# Patient Record
Sex: Male | Born: 1965
Health system: Southern US, Community
[De-identification: ages and names within clinical notes are randomized; demographics above are authoritative.]

## PROBLEM LIST (undated history)

## (undated) DIAGNOSIS — I10 Essential (primary) hypertension: Secondary | ICD-10-CM

## (undated) DIAGNOSIS — Z8619 Personal history of other infectious and parasitic diseases: Secondary | ICD-10-CM

## (undated) HISTORY — DX: Personal history of other infectious and parasitic diseases: Z86.19

---
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
## 2012-10-22 ENCOUNTER — Ambulatory Visit: Payer: Self-pay | Admitting: Family Medicine

## 2013-10-28 ENCOUNTER — Other Ambulatory Visit (HOSPITAL_COMMUNITY): Payer: Self-pay | Admitting: Urology

## 2013-10-28 DIAGNOSIS — N50819 Testicular pain, unspecified: Secondary | ICD-10-CM

## 2013-11-10 ENCOUNTER — Ambulatory Visit (HOSPITAL_COMMUNITY): Payer: 59

## 2014-06-18 LAB — LIPID PANEL
CHOLESTEROL: 177 mg/dL (ref 0–200)
HDL: 52 mg/dL (ref 35–70)
LDL Cholesterol: 106 mg/dL
Triglycerides: 94 mg/dL (ref 40–160)

## 2014-06-18 LAB — BASIC METABOLIC PANEL
BUN: 12 mg/dL (ref 4–21)
Creatinine: 1.2 mg/dL (ref 0.6–1.3)
Glucose: 95 mg/dL
POTASSIUM: 4.3 mmol/L (ref 3.4–5.3)
SODIUM: 142 mmol/L (ref 137–147)

## 2014-06-18 LAB — TSH: TSH: 0.88 u[IU]/mL (ref 0.41–5.90)

## 2015-07-13 ENCOUNTER — Encounter: Payer: Self-pay | Admitting: Family Medicine

## 2015-07-13 ENCOUNTER — Ambulatory Visit (INDEPENDENT_AMBULATORY_CARE_PROVIDER_SITE_OTHER): Payer: 59 | Admitting: Family Medicine

## 2015-07-13 VITALS — BP 148/94 | HR 84 | Temp 98.9°F | Resp 16 | Ht 70.25 in | Wt 207.0 lb

## 2015-07-13 DIAGNOSIS — N529 Male erectile dysfunction, unspecified: Secondary | ICD-10-CM

## 2015-07-13 DIAGNOSIS — Z0000 Encounter for general adult medical examination without abnormal findings: Secondary | ICD-10-CM | POA: Diagnosis not present

## 2015-07-13 DIAGNOSIS — R030 Elevated blood-pressure reading, without diagnosis of hypertension: Secondary | ICD-10-CM | POA: Diagnosis not present

## 2015-07-13 DIAGNOSIS — N528 Other male erectile dysfunction: Secondary | ICD-10-CM

## 2015-07-13 DIAGNOSIS — J309 Allergic rhinitis, unspecified: Secondary | ICD-10-CM | POA: Insufficient documentation

## 2015-07-13 DIAGNOSIS — I10 Essential (primary) hypertension: Secondary | ICD-10-CM | POA: Insufficient documentation

## 2015-07-13 DIAGNOSIS — M549 Dorsalgia, unspecified: Secondary | ICD-10-CM | POA: Insufficient documentation

## 2015-07-13 DIAGNOSIS — K219 Gastro-esophageal reflux disease without esophagitis: Secondary | ICD-10-CM | POA: Insufficient documentation

## 2015-07-13 MED ORDER — TADALAFIL 20 MG PO TABS: 20.0000 mg | ORAL_TABLET | ORAL | Status: DC | PRN

## 2015-07-13 NOTE — Progress Notes (Signed)
Patient: Andrew Green Boddy, Male    DOB: [birth year], 49 y.o.   MRN: [id number] Visit Date: 07/13/2015  Today's Provider: Mila Merryonald Bettie Capistran, MD   Chief Complaint  Patient presents with  . Annual Exam   Subjective:    Annual physical exam Andrew Green Jamil is a 49 y.o. male who presents today for health maintenance and complete physical. He feels well. He reports exercising; yes. He reports he is sleeping well.  -----------------------------------------------------------------  Follow-up for ED from 06/14/2014; started Cialis 20 mg x1 qd prn. Follow-up for elevated Bp from 06/14/2014; advised to exercise to stay healthy.   Review of Systems  Constitutional: Negative.   HENT: Positive for tinnitus.   Eyes: Negative.   Respiratory: Negative.   Cardiovascular: Negative.   Gastrointestinal: Negative.   Endocrine: Negative.   Genitourinary: Negative.   Musculoskeletal: Positive for back pain.  Skin: Negative.   Allergic/Immunologic: Negative.   Neurological: Negative.   Hematological: Negative.   Psychiatric/Behavioral: Negative.     Social History He  reports that he has never smoked. He does not have any smokeless tobacco history on file. He reports that he does not drink alcohol or use illicit drugs. Social History   Social History  . Marital Status: Married    Spouse Name: N/A  . Number of Children: N/A  . Years of Education: N/A   Social History Main Topics  . Smoking status: Never Smoker   . Smokeless tobacco: None  . Alcohol Use: No  . Drug Use: No  . Sexual Activity: Not Asked   Other Topics Concern  . None   Social History Narrative    Patient Active Problem List   Diagnosis Date Noted  . Allergic rhinitis 07/13/2015  . Back pain with radiation 07/13/2015  . Blood pressure elevated without history of HTN 07/13/2015  . GERD (gastroesophageal reflux disease) 07/13/2015  . Testicular hypofunction 02/07/2010  . ED (erectile dysfunction) of organic  origin 12/20/2009    Past Surgical History  Procedure Laterality Date  . Hernia repair  1970    Family History  Family Status  Relation Status Death Age  . Mother Alive   . Father Alive   . Sister Alive   . Maternal Grandmother Deceased   . Paternal Grandmother Deceased    His family history includes Brain cancer in his maternal grandmother; Cervical cancer in his mother; Colon polyps in his father; Congestive Heart Failure in his paternal grandmother; Lung cancer in his maternal grandmother. There is no history of CAD.    No Known Allergies  Previous Medications   TADALAFIL (CIALIS) 20 MG TABLET    Take 1 tablet by mouth as needed. No more than one in a day    Patient Care Team: Malva Limesonald E Ladarion Munyon, MD as PCP - General (Family Medicine) Deirdre Eveneravid C Kowalski, MD (Dermatology)     Objective:   Vitals: BP 140/94 mmHg  Pulse 84  Temp(Src) 98.9 F (37.2 C) (Oral)  Resp 16  Ht 5' 10.25" (1.784 m)  Wt 207 lb (93.895 kg)  BMI 29.50 kg/m2  SpO2 96%   Physical Exam   General Appearance:    Alert, cooperative, no distress, appears stated age  Head:    Normocephalic, without obvious abnormality, atraumatic  Eyes:    PERRL, conjunctiva/corneas clear, EOM's intact, fundi    benign, both eyes       Ears:    Normal TM's and external ear canals, both ears  Nose:  Nares normal, septum midline, mucosa normal, no drainage   or sinus tenderness  Throat:   Lips, mucosa, and tongue normal; teeth and gums normal  Neck:   Supple, symmetrical, trachea midline, no adenopathy;       thyroid:  No enlargement/tenderness/nodules; no carotid   bruit or JVD  Back:     Symmetric, no curvature, ROM normal, no CVA tenderness  Lungs:     Clear to auscultation bilaterally, respirations unlabored  Chest wall:    No tenderness or deformity  Heart:    Regular rate and rhythm, S1 and S2 normal, no murmur, rub   or gallop  Abdomen:     Soft, non-tender, bowel sounds active all four quadrants,    no  masses, no organomegaly  Genitalia:    penis: no lesions or discharge. testes: no masses or tenderness. no hernias  Rectal:    deferred  Extremities:   Extremities normal, atraumatic, no cyanosis or edema  Pulses:   2+ and symmetric all extremities  Skin:   Skin color, texture, turgor normal, no rashes or lesions  Lymph nodes:   Cervical, supraclavicular, and axillary nodes normal  Neurologic:   CNII-XII intact. Normal strength, sensation and reflexes      throughout    Depression Screen PHQ 2/9 Scores 07/13/2015  PHQ - 2 Score 0  PHQ- 9 Score 2      Assessment & Plan:     Routine Health Maintenance and Physical Exam  Exercise Activities and Dietary recommendations Goals    None      Immunization History  Administered Date(s) Administered  . Tdap 12/20/2009    Health Maintenance  Topic Date Due  . HIV Screening  09/01/1981  . INFLUENZA VACCINE  03/22/2015  . TETANUS/TDAP  12/21/2019      Discussed health benefits of physical activity, and encouraged him to engage in regular exercise appropriate for his age and condition.    --------------------------------------------------------------------  1. Annual physical exam Encourage increase exercise and wt loss  2. Blood pressure elevated without history of HTN DASH diet. Recheck BP in 4 months. Advised will need medications if not better at follow up.   3. ED (erectile dysfunction) of organic origin  - tadalafil (CIALIS) 20 MG tablet; Take 1 tablet (20 mg total) by mouth as needed. No more than one in a day  Dispense: 10 tablet; Refill: 4

## 2015-07-13 NOTE — Patient Instructions (Signed)
DASH Eating Plan  DASH stands for "Dietary Approaches to Stop Hypertension." The DASH eating plan is a healthy eating plan that has been shown to reduce high blood pressure (hypertension). Additional health benefits may include reducing the risk of type 2 diabetes mellitus, heart disease, and stroke. The DASH eating plan may also help with weight loss.  WHAT DO I NEED TO KNOW ABOUT THE DASH EATING PLAN?  For the DASH eating plan, you will follow these general guidelines:  · Choose foods with a percent daily value for sodium of less than 5% (as listed on the food label).  · Use salt-free seasonings or herbs instead of table salt or sea salt.  · Check with your health care provider or pharmacist before using salt substitutes.  · Eat lower-sodium products, often labeled as "lower sodium" or "no salt added."  · Eat fresh foods.  · Eat more vegetables, fruits, and low-fat dairy products.  · Choose whole grains. Look for the word "whole" as the first word in the ingredient list.  · Choose fish and skinless chicken or turkey more often than red meat. Limit fish, poultry, and meat to 6 oz (170 g) each day.  · Limit sweets, desserts, sugars, and sugary drinks.  · Choose heart-healthy fats.  · Limit cheese to 1 oz (28 g) per day.  · Eat more home-cooked food and less restaurant, buffet, and fast food.  · Limit fried foods.  · Cook foods using methods other than frying.  · Limit canned vegetables. If you do use them, rinse them well to decrease the sodium.  · When eating at a restaurant, ask that your food be prepared with less salt, or no salt if possible.  WHAT FOODS CAN I EAT?  Seek help from a dietitian for individual calorie needs.  Grains  Whole grain or whole wheat bread. Brown rice. Whole grain or whole wheat pasta. Quinoa, bulgur, and whole grain cereals. Low-sodium cereals. Corn or whole wheat flour tortillas. Whole grain cornbread. Whole grain crackers. Low-sodium crackers.  Vegetables  Fresh or frozen vegetables  (raw, steamed, roasted, or grilled). Low-sodium or reduced-sodium tomato and vegetable juices. Low-sodium or reduced-sodium tomato sauce and paste. Low-sodium or reduced-sodium canned vegetables.   Fruits  All fresh, canned (in natural juice), or frozen fruits.  Meat and Other Protein Products  Ground beef (85% or leaner), grass-fed beef, or beef trimmed of fat. Skinless chicken or turkey. Ground chicken or turkey. Pork trimmed of fat. All fish and seafood. Eggs. Dried beans, peas, or lentils. Unsalted nuts and seeds. Unsalted canned beans.  Dairy  Low-fat dairy products, such as skim or 1% milk, 2% or reduced-fat cheeses, low-fat ricotta or cottage cheese, or plain low-fat yogurt. Low-sodium or reduced-sodium cheeses.  Fats and Oils  Tub margarines without trans fats. Light or reduced-fat mayonnaise and salad dressings (reduced sodium). Avocado. Safflower, olive, or canola oils. Natural peanut or almond butter.  Other  Unsalted popcorn and pretzels.  The items listed above may not be a complete list of recommended foods or beverages. Contact your dietitian for more options.  WHAT FOODS ARE NOT RECOMMENDED?  Grains  White bread. White pasta. White rice. Refined cornbread. Bagels and croissants. Crackers that contain trans fat.  Vegetables  Creamed or fried vegetables. Vegetables in a cheese sauce. Regular canned vegetables. Regular canned tomato sauce and paste. Regular tomato and vegetable juices.  Fruits  Dried fruits. Canned fruit in light or heavy syrup. Fruit juice.  Meat and Other Protein   Products  Fatty cuts of meat. Ribs, chicken wings, bacon, sausage, bologna, salami, chitterlings, fatback, hot dogs, bratwurst, and packaged luncheon meats. Salted nuts and seeds. Canned beans with salt.  Dairy  Whole or 2% milk, cream, half-and-half, and cream cheese. Whole-fat or sweetened yogurt. Full-fat cheeses or blue cheese. Nondairy creamers and whipped toppings. Processed cheese, cheese spreads, or cheese  curds.  Condiments  Onion and garlic salt, seasoned salt, table salt, and sea salt. Canned and packaged gravies. Worcestershire sauce. Tartar sauce. Barbecue sauce. Teriyaki sauce. Soy sauce, including reduced sodium. Steak sauce. Fish sauce. Oyster sauce. Cocktail sauce. Horseradish. Ketchup and mustard. Meat flavorings and tenderizers. Bouillon cubes. Hot sauce. Tabasco sauce. Marinades. Taco seasonings. Relishes.  Fats and Oils  Butter, stick margarine, lard, shortening, ghee, and bacon fat. Coconut, palm kernel, or palm oils. Regular salad dressings.  Other  Pickles and olives. Salted popcorn and pretzels.  The items listed above may not be a complete list of foods and beverages to avoid. Contact your dietitian for more information.  WHERE CAN I FIND MORE INFORMATION?  National Heart, Lung, and [employer] Institute: www.nhlbi.nih.gov/health/health-topics/topics/dash/     This information is not intended to replace advice given to you by your health care provider. Make sure you discuss any questions you have with your health care provider.     Document Released: 07/27/2011 Document Revised: 08/28/2014 Document Reviewed: 06/11/2013  Elsevier Interactive Patient Education ©2016 [employer] Inc[employer]

## 2015-07-14 ENCOUNTER — Encounter: Payer: Self-pay | Admitting: Family Medicine

## 2015-07-27 ENCOUNTER — Encounter: Payer: Self-pay | Admitting: Family Medicine

## 2015-07-27 ENCOUNTER — Ambulatory Visit (INDEPENDENT_AMBULATORY_CARE_PROVIDER_SITE_OTHER): Payer: 59 | Admitting: Family Medicine

## 2015-07-27 VITALS — BP 130/102 | HR 88 | Resp 16 | Ht 70.25 in

## 2015-07-27 DIAGNOSIS — H6122 Impacted cerumen, left ear: Secondary | ICD-10-CM

## 2015-07-27 DIAGNOSIS — H6120 Impacted cerumen, unspecified ear: Secondary | ICD-10-CM | POA: Insufficient documentation

## 2015-07-27 DIAGNOSIS — M792 Neuralgia and neuritis, unspecified: Secondary | ICD-10-CM | POA: Diagnosis not present

## 2015-07-27 MED ORDER — PREDNISONE 20 MG PO TABS: 20.0000 mg | ORAL_TABLET | Freq: Two times a day (BID) | ORAL | Status: AC

## 2015-07-27 MED ORDER — NEOMYCIN-POLYMYXIN-HC 3.5-10000-1 OT SUSP: 3.0000 [drp] | Freq: Four times a day (QID) | OTIC | Status: DC

## 2015-07-27 NOTE — Progress Notes (Signed)
       Patient: Andrew Green Male    DOB: [birth year]   49 y.o.   MRN: [id number] Visit Date: 07/27/2015  Today's Provider: Mila Merryonald Fisher, MD   Chief Complaint  Patient presents with  . Headache   Subjective:    HPI  Having sharp pain in left side of his head above his ear. Pain happens several times a day and lasts for 3-4 seconds. Pain began over the weekend. Patient has had nasal congestion and cough. Non-productive.    No Known Allergies Previous Medications   TADALAFIL (CIALIS) 20 MG TABLET    Take 1 tablet (20 mg total) by mouth as needed. No more than one in a day    Review of Systems  Constitutional: Negative for fever, chills, appetite change and fatigue.  HENT: Positive for congestion. Negative for ear pain, hearing loss, nosebleeds and trouble swallowing.   Eyes: Negative for pain and visual disturbance.  Respiratory: Negative for cough, chest tightness, shortness of breath and wheezing.   Cardiovascular: Negative for chest pain, palpitations and leg swelling.  Gastrointestinal: Negative for nausea, vomiting, abdominal pain, diarrhea, constipation and blood in stool.  Endocrine: Negative for polydipsia, polyphagia and polyuria.  Genitourinary: Negative for dysuria and flank pain.  Musculoskeletal: Negative for myalgias, back pain, joint swelling, arthralgias and neck stiffness.  Skin: Negative for color change, rash and wound.  Neurological: Negative for dizziness, tremors, seizures, speech difficulty, weakness, light-headedness and headaches.  Psychiatric/Behavioral: Negative for behavioral problems, confusion, sleep disturbance, dysphoric mood and decreased concentration. The patient is not nervous/anxious.   All other systems reviewed and are negative.   Social History  Substance Use Topics  . Smoking status: Never Smoker   . Smokeless tobacco: Not on file  . Alcohol Use: No   Objective:   BP 130/102 mmHg  Pulse 88  Resp 16  Ht 5' 10.25" (1.784  m)  Physical Exam  General Appearance:    Alert, cooperative, no distress  HENT:   bilateral TM normal without fluid or infection, neck without nodes and sinuses nontender. Left ear canal obstructed by cerumen. Mild redness and swelling of postteior canal after irrigation.   Eyes:    PERRL, conjunctiva/corneas clear, EOM's intact       Lungs:     Clear to auscultation bilaterally, respirations unlabored  Heart:    Regular rate and rhythm  Neurologic:   Awake, alert, oriented x 3. No apparent focal neurological           defect.           Assessment & Plan:     1. Neuralgia Possible secondary to OE or inflammation from cerumen impaction as below.  Start prednisone 20mg  twice a day x 7 days and cortisporin Otic drops. Marland Kitchen. He is to call if not completely resolved when finished.   2. Cerumen impaction, left Cleared by irrigation.   He is to call if not completely resolved in a week.       Mila Merryonald Fisher, MD  [facility]

## 2015-10-22 ENCOUNTER — Encounter: Payer: Self-pay | Admitting: Family Medicine

## 2015-10-22 ENCOUNTER — Ambulatory Visit (INDEPENDENT_AMBULATORY_CARE_PROVIDER_SITE_OTHER): Payer: 59 | Admitting: Family Medicine

## 2015-10-22 VITALS — BP 152/84 | HR 98 | Temp 99.1°F | Resp 18 | Wt 211.0 lb

## 2015-10-22 DIAGNOSIS — R52 Pain, unspecified: Secondary | ICD-10-CM

## 2015-10-22 DIAGNOSIS — B349 Viral infection, unspecified: Secondary | ICD-10-CM

## 2015-10-22 LAB — POC INFLUENZA A&B (BINAX/QUICKVUE)
Influenza A, POC: NEGATIVE
Influenza B, POC: NEGATIVE

## 2015-10-22 MED ORDER — OSELTAMIVIR PHOSPHATE 75 MG PO CAPS: 75.0000 mg | ORAL_CAPSULE | Freq: Two times a day (BID) | ORAL | Status: DC

## 2015-10-22 NOTE — Patient Instructions (Signed)

## 2015-10-22 NOTE — Progress Notes (Signed)
Patient ID: Andrew Green, male   DOB: 1965/08/26, 50 y.o.   MRN: [id number]       Patient: Andrew Green Male    DOB: 1965/08/26   50 y.o.   MRN: [id number] Visit Date: 10/22/2015  Today's Provider: Dortha Kernennis Chrismon, PA   Chief Complaint  Patient presents with  . Generalized Body Aches  . Cough   Subjective:    HPI Pt is here today for body aches and cough. He reports that the cough started last night. He took OTC cold medications and this morning when he woke up he has body aches. He reports that its not exactly his whole body that aches its the usual parts of his body are aching that normally ache when "something isn't right". He has a slight sore throat and nasal congestion. He denies any known high fevers (he does have a low grade today), or shortness of breath. He has a 478 month old grand child at home and wants to catch this quick if it could possibly be the flu.   Patient Active Problem List   Diagnosis Date Noted  . Neuralgia 07/27/2015  . Cerumen impaction 07/27/2015  . Allergic rhinitis 07/13/2015  . Blood pressure elevated without history of HTN 07/13/2015  . GERD (gastroesophageal reflux disease) 07/13/2015  . Testicular hypofunction 02/07/2010  . ED (erectile dysfunction) of organic origin 12/20/2009   Past Surgical History  Procedure Laterality Date  . Hernia repair  1970   Family History  Problem Relation Age of Onset  . Cervical cancer Mother   . Colon polyps Father   . Lung cancer Maternal Grandmother   . Brain cancer Maternal Grandmother   . Congestive Heart Failure Paternal Grandmother   . CAD Neg Hx    No Known Allergies   Previous Medications   TADALAFIL (CIALIS) 20 MG TABLET    Take 1 tablet (20 mg total) by mouth as needed. No more than one in a day    Review of Systems  Constitutional: Positive for chills, activity change and fatigue.  HENT: Positive for congestion, rhinorrhea and sore throat.   Eyes: Negative.   Respiratory: Negative.     Cardiovascular: Negative.   Gastrointestinal: Negative.   Endocrine: Negative.   Genitourinary: Negative.   Musculoskeletal: Positive for myalgias and arthralgias.  Skin: Negative.   Allergic/Immunologic: Negative.   Neurological: Negative.   Hematological: Negative.   Psychiatric/Behavioral: Negative.     Social History  Substance Use Topics  . Smoking status: Never Smoker   . Smokeless tobacco: Not on file  . Alcohol Use: No   Objective:   BP 152/84 mmHg  Pulse 98  Temp(Src) 99.1 F (37.3 C) (Oral)  Resp 18  Wt 211 lb (95.709 kg)  SpO2 98%  Physical Exam  Constitutional: He is oriented to person, place, and time. He appears well-developed and well-nourished. No distress.  HENT:  Head: Normocephalic and atraumatic.  Right Ear: Hearing and external ear normal.  Left Ear: Hearing and external ear normal.  Nose: Nose normal.  Mouth/Throat: Oropharynx is clear and moist.  Eyes: Conjunctivae, EOM and lids are normal. Right eye exhibits no discharge. Left eye exhibits no discharge. No scleral icterus.  Neck: Neck supple.  Cardiovascular: Normal rate and regular rhythm.   Pulmonary/Chest: Effort normal. No respiratory distress.  Abdominal: Soft. Bowel sounds are normal.  Musculoskeletal: Normal range of motion.  Neurological: He is alert and oriented to person, place, and time.  Skin: Skin is intact. No lesion  and no rash noted.  Psychiatric: He has a normal mood and affect. His speech is normal and behavior is normal. Thought content normal.      Assessment & Plan:     1. Body aches Onset yesterday with slight ticklish cough and generalized body aches with fatigue. No nausea, vomiting or diarrhea. Temperature low grade elevation today. May use Tylenol or Advil prn. Increase fluid intake and home to rest. Flu test negative. - POC Influenza A&B(BINAX/QUICKVUE)  2. Viral illness Recent onset with slight headache, cough and scratchy throat with generalized malaise. May  continue to use Nyquil prn cough and add Tamiflu. Recheck if no better in 5-7 days. Should be out of work for the next 4 days to be at home resting. Recheck prn. - oseltamivir (TAMIFLU) 75 MG capsule; Take 1 capsule (75 mg total) by mouth 2 (two) times daily.  Dispense: 10 capsule; Refill: 0       Dortha Kern, PA  [facility]

## 2015-10-27 ENCOUNTER — Encounter: Payer: Self-pay | Admitting: Family Medicine

## 2015-10-29 ENCOUNTER — Ambulatory Visit (INDEPENDENT_AMBULATORY_CARE_PROVIDER_SITE_OTHER): Payer: 59 | Admitting: Family Medicine

## 2015-10-29 ENCOUNTER — Encounter: Payer: Self-pay | Admitting: Family Medicine

## 2015-10-29 VITALS — BP 124/98 | HR 90 | Temp 98.8°F | Resp 14 | Wt 206.4 lb

## 2015-10-29 DIAGNOSIS — L089 Local infection of the skin and subcutaneous tissue, unspecified: Secondary | ICD-10-CM

## 2015-10-29 DIAGNOSIS — J00 Acute nasopharyngitis [common cold]: Secondary | ICD-10-CM | POA: Diagnosis not present

## 2015-10-29 DIAGNOSIS — L723 Sebaceous cyst: Secondary | ICD-10-CM

## 2015-10-29 DIAGNOSIS — J209 Acute bronchitis, unspecified: Secondary | ICD-10-CM

## 2015-10-29 MED ORDER — HYDROCODONE-HOMATROPINE 5-1.5 MG/5ML PO SYRP: 5.0000 mL | ORAL_SOLUTION | Freq: Three times a day (TID) | ORAL | Status: DC | PRN

## 2015-10-29 MED ORDER — CEPHALEXIN 500 MG PO CAPS: 500.0000 mg | ORAL_CAPSULE | Freq: Three times a day (TID) | ORAL | Status: DC

## 2015-10-29 NOTE — Progress Notes (Signed)
Patient ID: Andrew Ishiharaimothy L Lewey, male   DOB: 07/02/1966, 50 y.o.   MRN: [id number]   Patient: Andrew Green Male    DOB: 07/02/1966   50 y.o.   MRN: [id number] Visit Date: 10/29/2015  Today's Provider: Dortha Kernennis Chrismon, PA   Chief Complaint  Patient presents with  . Cough  . Nasal Congestion  . Generalized Body Aches   Subjective:    HPI Patient presents with cough, nasal congestion, and body aches. Patient was seen on 10/22/2015 and prescribed Tamiflu. Patient reports he finished the Tamiflu. Patient reports symptoms were worse on Wednesday and Thursday. Today patient feels better with mild symptoms. Nasal stuffiness more prevalent on the left with ticklish and hacking cough. Tried Advil Cold and Sinus, Claritin and some Mucinex over the past 3-4 days.  Past Medical History  Diagnosis Date  . History of chicken pox   . History of measles   . History of mumps    Patient Active Problem List   Diagnosis Date Noted  . Neuralgia 07/27/2015  . Cerumen impaction 07/27/2015  . Allergic rhinitis 07/13/2015  . Blood pressure elevated without history of HTN 07/13/2015  . GERD (gastroesophageal reflux disease) 07/13/2015  . Testicular hypofunction 02/07/2010  . ED (erectile dysfunction) of organic origin 12/20/2009   Past Surgical History  Procedure Laterality Date  . Hernia repair  1970   Family History  Problem Relation Age of Onset  . Cervical cancer Mother   . Colon polyps Father   . Lung cancer Maternal Grandmother   . Brain cancer Maternal Grandmother   . Congestive Heart Failure Paternal Grandmother   . CAD Neg Hx    Previous Medications   TADALAFIL (CIALIS) 20 MG TABLET    Take 1 tablet (20 mg total) by mouth as needed. No more than one in a day   No Known Allergies  Review of Systems  Constitutional:       Generalized body aches   HENT: Positive for congestion.   Eyes: Negative.   Respiratory: Positive for cough.   Cardiovascular: Negative.   Gastrointestinal:  Negative.   Endocrine: Negative.   Genitourinary: Negative.   Musculoskeletal: Negative.   Skin: Negative.   Allergic/Immunologic: Negative.   Neurological: Negative.   Hematological: Negative.   Psychiatric/Behavioral: Negative.     Social History  Substance Use Topics  . Smoking status: Never Smoker   . Smokeless tobacco: Not on file  . Alcohol Use: No   Objective:   BP 124/98 mmHg  Pulse 90  Temp(Src) 98.8 F (37.1 C) (Oral)  Resp 14  Wt 206 lb 6.4 oz (93.622 kg)  SpO2 97%  Physical Exam  Constitutional: He is oriented to person, place, and time. He appears well-developed.  HENT:  Head: Normocephalic.  Right Ear: External ear normal.  Left Ear: External ear normal.  Nose: Nose normal.  Mouth/Throat: Oropharynx is clear and moist.  Eyes: Conjunctivae and EOM are normal.  Neck: Normal range of motion. Neck supple.  Cardiovascular: Normal rate, regular rhythm and normal heart sounds.   Pulmonary/Chest: Effort normal.  Slightly coarse breath sounds  Abdominal: Soft. Bowel sounds are normal.  Neurological: He is alert and oriented to person, place, and time.  Skin:  1.5 cm inflamed sebaceous cyst on the right upper posterior shoulder. No local lymphadenopathy or lymphangitis.      Assessment & Plan:      1. Subacute bronchitis Still feel general malaise and coughing a lot despite treatment for suspected influenza last  week. Will check CBC for signs of secondary infections. Given Keflex for skin infection and subacute bronchitis. Increase fluid intake and recheck pending CBC report. - CBC with Differential/Platelet - cephALEXin (KEFLEX) 500 MG capsule; Take 1 capsule (500 mg total) by mouth 3 (three) times daily.  Dispense: 21 capsule; Refill: 0 - Comprehensive metabolic panel - HYDROcodone-homatropine (HYCODAN) 5-1.5 MG/5ML syrup; Take 5 mLs by mouth every 8 (eight) hours as needed for cough.  Dispense: 120 mL; Refill: 0  2. Infected sebaceous cyst of skin Cyst  has become larger and tender with redness over the past week. May apply moist heat and given antibiotic. Recheck prn.

## 2015-10-29 NOTE — Patient Instructions (Signed)
Epidermal Cyst An epidermal cyst is sometimes called a sebaceous cyst, epidermal inclusion cyst, or infundibular cyst. These cysts usually contain a substance that looks "pasty" or "cheesy" and may have a bad smell. This substance is a protein called keratin. Epidermal cysts are usually found on the face, neck, or trunk. They may also occur in the vaginal area or other parts of the genitalia of both men and women. Epidermal cysts are usually small, painless, slow-growing bumps or lumps that move freely under the skin. It is important not to try to pop them. This may cause an infection and lead to tenderness and swelling. CAUSES  Epidermal cysts may be caused by a deep penetrating injury to the skin or a plugged hair follicle, often associated with acne. SYMPTOMS  Epidermal cysts can become inflamed and cause:  Redness.  Tenderness.  Increased temperature of the skin over the bumps or lumps.  Grayish-white, bad smelling material that drains from the bump or lump. DIAGNOSIS  Epidermal cysts are easily diagnosed by your caregiver during an exam. Rarely, a tissue sample (biopsy) may be taken to rule out other conditions that may resemble epidermal cysts. TREATMENT   Epidermal cysts often get better and disappear on their own. They are rarely ever cancerous.  If a cyst becomes infected, it may become inflamed and tender. This may require opening and draining the cyst. Treatment with antibiotics may be necessary. When the infection is gone, the cyst may be removed with minor surgery.  Small, inflamed cysts can often be treated with antibiotics or by injecting steroid medicines.  Sometimes, epidermal cysts become large and bothersome. If this happens, surgical removal in your caregiver's office may be necessary. HOME CARE INSTRUCTIONS  Only take over-the-counter or prescription medicines as directed by your caregiver.  Take your antibiotics as directed. Finish them even if you start to feel  better. SEEK MEDICAL CARE IF:   Your cyst becomes tender, red, or swollen.  Your condition is not improving or is getting worse.  You have any other questions or concerns. MAKE SURE YOU:  Understand these instructions.  Will watch your condition.  Will get help right away if you are not doing well or get worse.   This information is not intended to replace advice given to you by your health care provider. Make sure you discuss any questions you have with your health care provider.   Document Released: 07/08/2004 Document Revised: 10/30/2011 Document Reviewed: 02/13/2011 Elsevier Interactive Patient Education 2016 Elsevier Inc.  

## 2015-10-30 LAB — CBC WITH DIFFERENTIAL/PLATELET
BASOS ABS: 0 10*3/uL (ref 0.0–0.2)
BASOS: 0 %
EOS (ABSOLUTE): 0.3 10*3/uL (ref 0.0–0.4)
Eos: 5 %
HEMATOCRIT: 46.1 % (ref 37.5–51.0)
Hemoglobin: 16.5 g/dL (ref 12.6–17.7)
Immature Grans (Abs): 0 10*3/uL (ref 0.0–0.1)
Immature Granulocytes: 0 %
LYMPHS ABS: 2.8 10*3/uL (ref 0.7–3.1)
LYMPHS: 53 %
MCH: 31.4 pg (ref 26.6–33.0)
MCHC: 35.8 g/dL — ABNORMAL HIGH (ref 31.5–35.7)
MCV: 88 fL (ref 79–97)
Monocytes Absolute: 0.5 10*3/uL (ref 0.1–0.9)
Monocytes: 10 %
Neutrophils Absolute: 1.7 10*3/uL (ref 1.4–7.0)
Neutrophils: 32 %
Platelets: 174 10*3/uL (ref 150–379)
RBC: 5.25 x10E6/uL (ref 4.14–5.80)
RDW: 13.2 % (ref 12.3–15.4)
WBC: 5.3 10*3/uL (ref 3.4–10.8)

## 2015-10-30 LAB — COMPREHENSIVE METABOLIC PANEL
ALK PHOS: 79 IU/L (ref 39–117)
ALT: 36 IU/L (ref 0–44)
AST: 29 IU/L (ref 0–40)
Albumin/Globulin Ratio: 1.5 (ref 1.1–2.5)
Albumin: 4.1 g/dL (ref 3.5–5.5)
BUN/Creatinine Ratio: 6 — ABNORMAL LOW (ref 9–20)
BUN: 7 mg/dL (ref 6–24)
Bilirubin Total: 0.5 mg/dL (ref 0.0–1.2)
CO2: 27 mmol/L (ref 18–29)
Calcium: 8.7 mg/dL (ref 8.7–10.2)
Chloride: 97 mmol/L (ref 96–106)
Creatinine, Ser: 1.12 mg/dL (ref 0.76–1.27)
GFR calc Af Amer: 89 mL/min/{1.73_m2} (ref >59)
GFR calc non Af Amer: 77 mL/min/{1.73_m2} (ref >59)
GLUCOSE: 96 mg/dL (ref 65–99)
Globulin, Total: 2.7 g/dL (ref 1.5–4.5)
Potassium: 3.8 mmol/L (ref 3.5–5.2)
Sodium: 136 mmol/L (ref 134–144)
Total Protein: 6.8 g/dL (ref 6.0–8.5)

## 2015-11-02 ENCOUNTER — Telehealth: Payer: Self-pay

## 2015-11-02 NOTE — Telephone Encounter (Signed)
-----   Message from Tamsen Roersennis E Chrismon, [place] sent at 11/01/2015  5:06 PM EDT ----- Essentially normal blood tests. No sign of infection in blood. Finish the Keflex to help sebaceous cyst and clear bronchitis. Recheck in the office in a week if needed.

## 2015-11-02 NOTE — Telephone Encounter (Signed)
Patient advised as directed below. Patient verbalized understanding.  

## 2015-11-10 ENCOUNTER — Encounter: Payer: Self-pay | Admitting: Family Medicine

## 2015-11-10 ENCOUNTER — Ambulatory Visit (INDEPENDENT_AMBULATORY_CARE_PROVIDER_SITE_OTHER): Payer: 59 | Admitting: Family Medicine

## 2015-11-10 VITALS — BP 148/100 | HR 84 | Temp 98.7°F | Resp 16 | Ht 70.5 in | Wt 211.0 lb

## 2015-11-10 DIAGNOSIS — I10 Essential (primary) hypertension: Secondary | ICD-10-CM

## 2015-11-10 NOTE — Progress Notes (Signed)
       Patient: Andrew Green Male    DOB: May 08, 1966   50 y.o.   MRN: [id number] Visit Date: 11/10/2015  Today's Provider: Mila Merryonald Pate Aylward, MD   Chief Complaint  Patient presents with  . Follow-up   Subjective:    HPI  Follow-up for elevated blood pressure without history of hypertension from 07/13/2015; recommended the DASH diet. Advised to recheck blood pressure in 4 months and will need medication if not better at follow-up. He states he has been eating much better, gets very little sodium in diet, but has not been exercising.   BP Readings from Last 3 Encounters:  11/10/15 144/94  07/13/15 140/94   Wt Readings from Last 3 Encounters:  11/10/15 211 lb (95.709 kg)  07/13/15 207 lb    CMP Latest Ref Rng 10/29/2015 06/18/2014  Glucose 65 - 99 mg/dL 96 -  BUN 6 - 24 mg/dL 7 12  Creatinine 4.090.76 - 1.27 mg/dL 8.111.12 1.2  Sodium 914134 - 144 mmol/L 136 142  Potassium 3.5 - 5.2 mmol/L 3.8 4.3  Chloride 96 - 106 mmol/L 97 -  CO2 18 - 29 mmol/L 27 -  Calcium 8.7 - 10.2 mg/dL 8.7 -  Total Protein 6.0 - 8.5 g/dL 6.8 -  Total Bilirubin 0.0 - 1.2 mg/dL 0.5 -  Alkaline Phos 39 - 117 IU/L 79 -  AST 0 - 40 IU/L 29 -  ALT 0 - 44 IU/L 36 -      No Known Allergies Previous Medications   TADALAFIL (CIALIS) 20 MG TABLET    Take 1 tablet (20 mg total) by mouth as needed. No more than one in a day    Review of Systems  Constitutional: Negative for fever, chills and appetite change.  Respiratory: Negative for chest tightness, shortness of breath and wheezing.   Cardiovascular: Negative for chest pain and palpitations.  Gastrointestinal: Negative for nausea, vomiting and abdominal pain.    Social History  Substance Use Topics  . Smoking status: Never Smoker   . Smokeless tobacco: Not on file  . Alcohol Use: No   Objective:     Filed Vitals:   11/10/15 1554 11/10/15 1638  BP: 144/94 148/100  Pulse: 84   Temp: 98.7 F (37.1 C)   TempSrc: Oral   Resp: 16   Height: 5' 10.5"  (1.791 m)   Weight: 211 lb (95.709 kg)   SpO2: 97%      Physical Exam   General Appearance:    Alert, cooperative, no distress  Eyes:    PERRL, conjunctiva/corneas clear, EOM's intact       Lungs:     Clear to auscultation bilaterally, respirations unlabored  Heart:    Regular rate and rhythm  Neurologic:   Awake, alert, oriented x 3. No apparent focal neurological           defect.           Assessment & Plan:     1. Essential hypertension Recommended starting antihypertensive medications. Discussed risks/benefits of medications versus untreated hypertension. He is very opposed to taking prescription medications. Reviewed dietary recommendations and need to exercise 150 minutes per week and attempt 6-10 pound weight loss over the next 4 months. Will again recommend medications if BP not to goal at follow up in 4 months.        Mila Merryonald Lianah Peed, MD  [facility]

## 2015-11-22 ENCOUNTER — Ambulatory Visit (INDEPENDENT_AMBULATORY_CARE_PROVIDER_SITE_OTHER): Payer: 59 | Admitting: Family Medicine

## 2015-11-22 ENCOUNTER — Encounter: Payer: Self-pay | Admitting: Family Medicine

## 2015-11-22 VITALS — BP 116/98 | HR 60 | Temp 98.5°F | Resp 16 | Wt 208.0 lb

## 2015-11-22 DIAGNOSIS — J069 Acute upper respiratory infection, unspecified: Secondary | ICD-10-CM

## 2015-11-22 DIAGNOSIS — J029 Acute pharyngitis, unspecified: Secondary | ICD-10-CM | POA: Diagnosis not present

## 2015-11-22 LAB — POCT RAPID STREP A (OFFICE): Rapid Strep A Screen: NEGATIVE

## 2015-11-22 NOTE — Patient Instructions (Signed)
Allergic Rhinitis Allergic rhinitis is when the mucous membranes in the nose respond to allergens. Allergens are particles in the air that cause your body to have an allergic reaction. This causes you to release allergic antibodies. Through a chain of events, these eventually cause you to release histamine into the blood stream. Although meant to protect the body, it is this release of histamine that causes your discomfort, such as frequent sneezing, congestion, and an itchy, runny nose.  CAUSES Seasonal allergic rhinitis (hay fever) is caused by pollen allergens that may come from grasses, trees, and weeds. Year-round allergic rhinitis (perennial allergic rhinitis) is caused by allergens such as house dust mites, pet dander, and mold spores. SYMPTOMS  Nasal stuffiness (congestion).  Itchy, runny nose with sneezing and tearing of the eyes. DIAGNOSIS Your health care provider can help you determine the allergen or allergens that trigger your symptoms. If you and your health care provider are unable to determine the allergen, skin or blood testing may be used. Your health care provider will diagnose your condition after taking your health history and performing a physical exam. Your health care provider may assess you for other related conditions, such as asthma, pink eye, or an ear infection. TREATMENT Allergic rhinitis does not have a cure, but it can be controlled by:  Medicines that block allergy symptoms. These may include allergy shots, nasal sprays, and oral antihistamines.  Avoiding the allergen. Hay fever may often be treated with antihistamines in pill or nasal spray forms. Antihistamines block the effects of histamine. There are over-the-counter medicines that may help with nasal congestion and swelling around the eyes. Check with your health care provider before taking or giving this medicine. If avoiding the allergen or the medicine prescribed do not work, there are many new medicines  your health care provider can prescribe. Stronger medicine may be used if initial measures are ineffective. Desensitizing injections can be used if medicine and avoidance does not work. Desensitization is when a patient is given ongoing shots until the body becomes less sensitive to the allergen. Make sure you follow up with your health care provider if problems continue. HOME CARE INSTRUCTIONS It is not possible to completely avoid allergens, but you can reduce your symptoms by taking steps to limit your exposure to them. It helps to know exactly what you are allergic to so that you can avoid your specific triggers. SEEK MEDICAL CARE IF:  You have a fever.  You develop a cough that does not stop easily (persistent).  You have shortness of breath.  You start wheezing.  Symptoms interfere with normal daily activities.   This information is not intended to replace advice given to you by your health care provider. Make sure you discuss any questions you have with your health care provider.   Document Released: 05/02/2001 Document Revised: 08/28/2014 Document Reviewed: 04/14/2013 Elsevier Interactive Patient Education 2016 Elsevier Inc.  

## 2015-11-22 NOTE — Progress Notes (Signed)
Patient ID: Andrew Green, male   DOB: 1966/05/21, 50 y.o.   MRN: [id number]       Patient: Andrew Green Male    DOB: 22-Mar-1966   50 y.o.   MRN: [id number] Visit Date: 11/22/2015  Today's Provider: Dortha Kern, PA   Chief Complaint  Patient presents with  . Sore Throat   Subjective:    HPI   Patient comes in office today with concerns of sore throat since Friday 3/31. Patient states that he has had a lot of post nasal drainage but states that his cough is dry. Patient complains of irritation in back of throat and he has noticed white spots in the back of his mouth. Patient denies being expose to strep. Patient reports taking otc Claritin and Tylenol for relief. Denies sneezing but stuffy nose on the left. Slight help with cool drink. Describe sore throat as a scratchy, swollen throat with raspy voice.  Past Medical History  Diagnosis Date  . History of chicken pox   . History of measles   . History of mumps    Patient Active Problem List   Diagnosis Date Noted  . Neuralgia 07/27/2015  . Cerumen impaction 07/27/2015  . Allergic rhinitis 07/13/2015  . Blood pressure elevated without history of HTN 07/13/2015  . GERD (gastroesophageal reflux disease) 07/13/2015  . Testicular hypofunction 02/07/2010  . ED (erectile dysfunction) of organic origin 12/20/2009   Past Surgical History  Procedure Laterality Date  . Hernia repair  1970   Family History  Problem Relation Age of Onset  . Cervical cancer Mother   . Colon polyps Father   . Lung cancer Maternal Grandmother   . Brain cancer Maternal Grandmother   . Congestive Heart Failure Paternal Grandmother   . CAD Neg Hx    No Known Allergies Previous Medications   TADALAFIL (CIALIS) 20 MG TABLET    Take 1 tablet (20 mg total) by mouth as needed. No more than one in a day    Review of Systems  Constitutional: Negative for fever, chills, diaphoresis, activity change, appetite change, fatigue and unexpected weight  change.  HENT: Positive for congestion, postnasal drip, sinus pressure, sore throat and trouble swallowing. Negative for dental problem, drooling, ear discharge, ear pain, facial swelling, hearing loss, mouth sores, nosebleeds, rhinorrhea, sneezing, tinnitus and voice change.   Eyes: Negative.   Respiratory: Positive for cough. Negative for apnea, choking, chest tightness, wheezing and stridor.   Cardiovascular: Negative.   Gastrointestinal: Negative.   Endocrine: Negative.   Genitourinary: Negative.   Musculoskeletal: Negative.   Skin: Negative.   Allergic/Immunologic: Positive for environmental allergies.  Neurological: Positive for tremors. Negative for seizures, syncope, facial asymmetry, speech difficulty, light-headedness, numbness and headaches.  Psychiatric/Behavioral: Negative.     Social History  Substance Use Topics  . Smoking status: Never Smoker   . Smokeless tobacco: Not on file  . Alcohol Use: No   Objective:   BP 116/98 mmHg  Pulse 60  Temp(Src) 98.5 F (36.9 C) (Oral)  Resp 16  Wt 208 lb (94.348 kg)  Physical Exam  Constitutional: He is oriented to person, place, and time. He appears well-developed and well-nourished.  HENT:  Head: Normocephalic and atraumatic.  Right Ear: External ear normal.  Left Ear: External ear normal.  Narrow nostrils without redness of mucus membranes. No sinus tenderness with good transillumination throughout. Posterior pharynx reddened without exudates. Some cobblestone appearance.  Eyes: Conjunctivae and EOM are normal.  Neck: Normal range of motion.  Neck supple.  Cardiovascular: Normal rate.   Pulmonary/Chest: Effort normal and breath sounds normal.  Abdominal: Bowel sounds are normal.  Lymphadenopathy:    He has no cervical adenopathy.  Neurological: He is alert and oriented to person, place, and time.  Skin: No rash noted.  Psychiatric: He has a normal mood and affect. His behavior is normal.      Assessment & Plan:       1. Sore throat Onset over the past 3 days without exudates. Strep test negative. Feels some postnasal drip and scratchy cough. May use Chloroseptic spray and Zyrtec. Increase fluid intake and recheck prn. - POCT rapid strep A  2. Upper respiratory infection Slight cough without sputum production and stuffiness in the left nostril with sore throat. Suspect allergy component. May use Flonase 2 sprays each nostril at bedtime. Recheck prn. No fever today or at home.       Dortha Kernennis Toneka Fullen, PA  [facility]

## 2016-03-08 ENCOUNTER — Ambulatory Visit: Payer: 59 | Admitting: Family Medicine

## 2016-03-29 ENCOUNTER — Ambulatory Visit (INDEPENDENT_AMBULATORY_CARE_PROVIDER_SITE_OTHER): Payer: 59 | Admitting: Family Medicine

## 2016-03-29 ENCOUNTER — Encounter: Payer: Self-pay | Admitting: Family Medicine

## 2016-03-29 VITALS — BP 140/96 | HR 92 | Temp 98.5°F | Resp 16 | Wt 209.0 lb

## 2016-03-29 DIAGNOSIS — R030 Elevated blood-pressure reading, without diagnosis of hypertension: Secondary | ICD-10-CM | POA: Diagnosis not present

## 2016-03-29 NOTE — Progress Notes (Signed)
       Patient: Andrew Green Male    DOB: 01-16-1966   50 y.o.   MRN: [id number] Visit Date: 03/29/2016  Today's Provider: Mila Merryonald Fisher, MD   Chief Complaint  Patient presents with  . Other    High blood pressure.    Subjective:    HPI  Elevated blood pressure, follow-up:  BP Readings from Last 3 Encounters:  03/29/16 (!) 140/96  11/22/15 (!) 116/98  11/10/15 (!) 148/100    He was last seen for hypertension 5 months ago.  BP at that visit was 148/100 Management changes since that visit include patient refused medication, will work on Lifestyle changes. He reports fair compliance with treatment.  He is exercising. He is adherent to low salt diet.   Outside blood pressures are not being checked. He is experiencing none.  Patient denies chest pain.   Cardiovascular risk factors include none.  Use of agents associated with hypertension: none.     Weight trend: stable Wt Readings from Last 3 Encounters:  03/29/16 209 lb (94.8 kg)  11/22/15 208 lb (94.3 kg)  11/10/15 211 lb (95.7 kg)    Current diet: in general, an "unhealthy" diet  Only dietary change is cutting down on salt in food.   ------------------------------------------------------------------------      No Known Allergies Current Meds  Medication Sig  . tadalafil (CIALIS) 20 MG tablet Take 1 tablet (20 mg total) by mouth as needed. No more than one in a day    Review of Systems  Constitutional: Negative.  Negative for appetite change, chills and fever.  Respiratory: Negative.  Negative for chest tightness, shortness of breath and wheezing.   Cardiovascular: Negative.  Negative for chest pain and palpitations.  Gastrointestinal: Negative for abdominal pain, nausea and vomiting.    Social History  Substance Use Topics  . Smoking status: Never Smoker  . Smokeless tobacco: Never Used  . Alcohol use No   Objective:   BP (!) 140/96 (BP Location: Left Arm, Patient Position: Sitting, Cuff Size:  Large)   Pulse 92   Temp 98.5 F (36.9 C) (Oral)   Resp 16   Wt 209 lb (94.8 kg)   SpO2 96%   BMI 29.56 kg/m   Physical Exam   General Appearance:    Alert, cooperative, no distress  Eyes:    PERRL, conjunctiva/corneas clear, EOM's intact       Lungs:     Clear to auscultation bilaterally, respirations unlabored  Heart:    Regular rate and rhythm  Neurologic:   Awake, alert, oriented x 3. No apparent focal neurological           defect.          Assessment & Plan:     1. Blood pressure elevated without history of HTN Improving, but not to goal. No other cardiac risk factors. Will continue to work on losing weight with diet and exercise. Recommend he return for CPE after age 50 and reassess BP at that time.        Mila Merryonald Fisher, MD  [facility]

## 2016-09-06 ENCOUNTER — Encounter: Payer: 59 | Admitting: Family Medicine

## 2016-09-27 ENCOUNTER — Encounter: Payer: Self-pay | Admitting: Family Medicine

## 2016-09-27 ENCOUNTER — Ambulatory Visit (INDEPENDENT_AMBULATORY_CARE_PROVIDER_SITE_OTHER): Payer: 59 | Admitting: Family Medicine

## 2016-09-27 VITALS — BP 144/100 | HR 94 | Temp 98.2°F | Resp 16 | Ht 70.75 in | Wt 211.0 lb

## 2016-09-27 DIAGNOSIS — Z125 Encounter for screening for malignant neoplasm of prostate: Secondary | ICD-10-CM | POA: Diagnosis not present

## 2016-09-27 DIAGNOSIS — Z0000 Encounter for general adult medical examination without abnormal findings: Secondary | ICD-10-CM | POA: Diagnosis not present

## 2016-09-27 DIAGNOSIS — N529 Male erectile dysfunction, unspecified: Secondary | ICD-10-CM

## 2016-09-27 DIAGNOSIS — Z1211 Encounter for screening for malignant neoplasm of colon: Secondary | ICD-10-CM | POA: Diagnosis not present

## 2016-09-27 DIAGNOSIS — Z23 Encounter for immunization: Secondary | ICD-10-CM | POA: Diagnosis not present

## 2016-09-27 DIAGNOSIS — I10 Essential (primary) hypertension: Secondary | ICD-10-CM

## 2016-09-27 MED ORDER — TADALAFIL 20 MG PO TABS: 20.0000 mg | ORAL_TABLET | ORAL | 4 refills | Status: DC | PRN

## 2016-09-27 MED ORDER — AMLODIPINE BESYLATE 5 MG PO TABS: 5.0000 mg | ORAL_TABLET | Freq: Every day | ORAL | 2 refills | Status: DC

## 2016-09-27 NOTE — Patient Instructions (Addendum)
I recommend that you get a flu vaccine this year. Please call our office at [phone] at your earliest convenience to schedule a flu shot.      DASH Eating Plan DASH stands for "Dietary Approaches to Stop Hypertension." The DASH eating plan is a healthy eating plan that has been shown to reduce high blood pressure (hypertension). Additional health benefits may include reducing the risk of type 2 diabetes mellitus, heart disease, and stroke. The DASH eating plan may also help with weight loss. What do I need to know about the DASH eating plan? For the DASH eating plan, you will follow these general guidelines:  Choose foods with less than 150 milligrams of sodium per serving (as listed on the food label).  Use salt-free seasonings or herbs instead of table salt or sea salt.  Check with your health care provider or pharmacist before using salt substitutes.  Eat lower-sodium products. These are often labeled as "low-sodium" or "no salt added."  Eat fresh foods. Avoid eating a lot of canned foods.  Eat more vegetables, fruits, and low-fat dairy products.  Choose whole grains. Look for the word "whole" as the first word in the ingredient list.  Choose fish and skinless chicken or [place]key more often than red meat. Limit fish, poultry, and meat to 6 oz (170 g) each day.  Limit sweets, desserts, sugars, and sugary drinks.  Choose heart-healthy fats.  Eat more home-cooked food and less restaurant, buffet, and fast food.  Limit fried foods.  Do not fry foods. Cook foods using methods such as baking, boiling, grilling, and broiling instead.  When eating at a restaurant, ask that your food be prepared with less salt, or no salt if possible. What foods can I eat? Seek help from a dietitian for individual calorie needs. Grains  Whole grain or whole wheat bread. Brown rice. Whole grain or whole wheat pasta. Quinoa, bulgur, and whole grain cereals. Low-sodium cereals. Corn or whole wheat  flour tortillas. Whole grain cornbread. Whole grain crackers. Low-sodium crackers. Vegetables  Fresh or frozen vegetables (raw, steamed, roasted, or grilled). Low-sodium or reduced-sodium tomato and vegetable juices. Low-sodium or reduced-sodium tomato sauce and paste. Low-sodium or reduced-sodium canned vegetables. Fruits  All fresh, canned (in natural juice), or frozen fruits. Meat and Other Protein Products  Ground beef (85% or leaner), grass-fed beef, or beef trimmed of fat. Skinless chicken or [place]key. Ground chicken or [place]key. Pork trimmed of fat. All fish and seafood. Eggs. Dried beans, peas, or lentils. Unsalted nuts and seeds. Unsalted canned beans. Dairy  Low-fat dairy products, such as skim or 1% milk, 2% or reduced-fat cheeses, low-fat ricotta or cottage cheese, or plain low-fat yogurt. Low-sodium or reduced-sodium cheeses. Fats and Oils  Tub margarines without trans fats. Light or reduced-fat mayonnaise and salad dressings (reduced sodium). Avocado. Safflower, olive, or canola oils. Natural peanut or almond butter. Other  Unsalted popcorn and pretzels. The items listed above may not be a complete list of recommended foods or beverages. Contact your dietitian for more options.  What foods are not recommended? Grains  White bread. White pasta. White rice. Refined cornbread. Bagels and croissants. Crackers that contain trans fat. Vegetables  Creamed or fried vegetables. Vegetables in a cheese sauce. Regular canned vegetables. Regular canned tomato sauce and paste. Regular tomato and vegetable juices. Fruits  Canned fruit in light or heavy syrup. Fruit juice. Meat and Other Protein Products  Fatty cuts of meat. Ribs, chicken wings, bacon, sausage, bologna, salami, chitterlings, fatback,  hot dogs, bratwurst, and packaged luncheon meats. Salted nuts and seeds. Canned beans with salt. Dairy  Whole or 2% milk, cream, half-and-half, and cream cheese. Whole-fat or sweetened yogurt. Full-fat  cheeses or blue cheese. Nondairy creamers and whipped toppings. Processed cheese, cheese spreads, or cheese curds. Condiments  Onion and garlic salt, seasoned salt, table salt, and sea salt. Canned and packaged gravies. Worcestershire sauce. Tartar sauce. Barbecue sauce. Teriyaki sauce. Soy sauce, including reduced sodium. Steak sauce. Fish sauce. Oyster sauce. Cocktail sauce. Horseradish. Ketchup and mustard. Meat flavorings and tenderizers. Bouillon cubes. Hot sauce. Tabasco sauce. Marinades. Taco seasonings. Relishes. Fats and Oils  Butter, stick margarine, lard, shortening, ghee, and bacon fat. Coconut, palm kernel, or palm oils. Regular salad dressings. Other  Pickles and olives. Salted popcorn and pretzels. The items listed above may not be a complete list of foods and beverages to avoid. Contact your dietitian for more information.  Where can I find more information? National Heart, Lung, and Blood Institute: CablePromo.itwww.nhlbi.nih.gov/health/health-topics/topics/dash/ This information is not intended to replace advice given to you by your health care provider. Make sure you discuss any questions you have with your health care provider. Document Released: 07/27/2011 Document Revised: 01/13/2016 Document Reviewed: 06/11/2013 Elsevier Interactive Patient Education  2017 [employer]

## 2016-09-27 NOTE — Progress Notes (Signed)
Patient: Andrew Green, Male    DOB: 07/19/1966, 51 y.o.   MRN: [id number] Visit Date: 09/27/2016  Today's Provider: [name], MD   Chief Complaint  Patient presents with  . Annual Exam  . Blood Pressure Check    follow up  . Erectile Dysfunction    follow up   Subjective:    Annual physical exam Andrew Green is a 51 y.o. male who presents today for health maintenance and complete physical. He feels fairly well. He reports rarley exercising. He reports he is sleeping fairly well.  -----------------------------------------------------------------  Elevated blood pressure, follow-up:  BP Readings from Last 3 Encounters:  03/29/16 (!) 140/96  11/22/15 (!) 116/98  11/10/15 (!) 148/100    He was last seen for hypertension 6 months ago.  BP at that visit was 140/96. Management since that visit includes counseling patient to work on weight loss with diet and exercise. He reports fair compliance with treatment. He is not having side effects.  He is not exercising. He is adherent to low salt diet.   Outside blood pressures are not being checked. He is experiencing none.  Patient denies chest pain, chest pressure/discomfort, claudication, dyspnea, exertional chest pressure/discomfort, fatigue, irregular heart beat, lower extremity edema, near-syncope, orthopnea, palpitations, paroxysmal nocturnal dyspnea, syncope and tachypnea.   Cardiovascular risk factors include male gender.  Use of agents associated with hypertension: none.     Weight trend: fluctuating a bit Wt Readings from Last 3 Encounters:  03/29/16 209 lb (94.8 kg)  11/22/15 208 lb (94.3 kg)  11/10/15 211 lb (95.7 kg)    Current diet: well balanced  ------------------------------------------------------------------------  Follow up of Erectile Dysfunction:  Patient was last seen for this problem more that 1 year ago. No changes were made during that visit. Current treatment includes Cialis.  Patient reports good compliance with treatment, and good tolerance.   Review of Systems  Constitutional: Negative for appetite change, chills, fatigue and fever.  HENT: Positive for sore throat. Negative for congestion, ear pain, hearing loss, nosebleeds and trouble swallowing.   Eyes: Negative for pain and visual disturbance.  Respiratory: Negative for cough, chest tightness and shortness of breath.   Cardiovascular: Negative for chest pain, palpitations and leg swelling.  Gastrointestinal: Negative for abdominal pain, blood in stool, constipation, diarrhea, nausea and vomiting.  Endocrine: Negative for polydipsia, polyphagia and polyuria.  Genitourinary: Negative for dysuria and flank pain.  Musculoskeletal: Negative for arthralgias, back pain, joint swelling, myalgias and neck stiffness.  Skin: Negative for color change, rash and wound.  Neurological: Negative for dizziness, tremors, seizures, speech difficulty, weakness, light-headedness and headaches.  Psychiatric/Behavioral: Negative for behavioral problems, confusion, decreased concentration, dysphoric mood and sleep disturbance. The patient is not nervous/anxious.   All other systems reviewed and are negative.   Social History      He  reports that he has never smoked. He has never used smokeless tobacco. He reports that he does not drink alcohol or use drugs.       Social History   Social History  . Marital status: Married    Spouse name: N/A  . Number of children: 2  . Years of education: N/A   Occupational History  . Machinist    Social History Main Topics  . Smoking status: Never Smoker  . Smokeless tobacco: Never Used  . Alcohol use No  . Drug use: No  . Sexual activity: Not Asked   Other Topics Concern  .  None   Social History Narrative  . None    Past Medical History:  Diagnosis Date  . History of chicken pox   . History of measles   . History of mumps      Patient Active Problem List   Diagnosis  Date Noted  . Neuralgia 07/27/2015  . Allergic rhinitis 07/13/2015  . Blood pressure elevated without history of HTN 07/13/2015  . GERD (gastroesophageal reflux disease) 07/13/2015  . Testicular hypofunction 02/07/2010  . ED (erectile dysfunction) of organic origin 12/20/2009    Past Surgical History:  Procedure Laterality Date  . HERNIA REPAIR  1970    Family History        Family Status  Relation Status  . Mother Alive  . Father Alive  . Sister Alive  . Maternal Grandmother Deceased  . Paternal Grandmother Deceased  . Daughter Alive  . Daughter Alive  . Neg Hx         His family history includes Brain cancer in his maternal grandmother; Cervical cancer in his mother; Colon polyps in his father; Congestive Heart Failure in his paternal grandmother; Lung cancer in his maternal grandmother.     No Known Allergies   Current Outpatient Prescriptions:  .  tadalafil (CIALIS) 20 MG tablet, Take 1 tablet (20 mg total) by mouth as needed. No more than one in a day, Disp: 10 tablet, Rfl: 4   Patient Care Team: Malva Limesonald E Jameek Bruntz, MD as PCP - General (Family Medicine) Deirdre Eveneravid C Kowalski, MD (Dermatology)      Objective:   Vitals: BP (!) 144/100 (BP Location: Left Arm, Patient Position: Sitting, Cuff Size: Large)   Pulse 94   Temp 98.2 F (36.8 C) (Oral)   Resp 16   Ht 5' 10.75" (1.797 m)   Wt 211 lb (95.7 kg)   SpO2 96% Comment: room air  BMI 29.64 kg/m    Physical Exam   General Appearance:    Alert, cooperative, no distress, appears stated age  Head:    Normocephalic, without obvious abnormality, atraumatic  Eyes:    PERRL, conjunctiva/corneas clear, EOM's intact, fundi    benign, both eyes       Ears:    Normal TM's and external ear canals, both ears  Nose:   Nares normal, septum midline, mucosa normal, no drainage   or sinus tenderness  Throat:   Lips, mucosa, and tongue normal; teeth and gums normal  Neck:   Supple, symmetrical, trachea midline, no adenopathy;        thyroid:  No enlargement/tenderness/nodules; no carotid   bruit or JVD  Back:     Symmetric, no curvature, ROM normal, no CVA tenderness  Lungs:     Clear to auscultation bilaterally, respirations unlabored  Chest wall:    No tenderness or deformity  Heart:    Regular rate and rhythm, S1 and S2 normal, no murmur, rub   or gallop  Abdomen:     Soft, non-tender, bowel sounds active all four quadrants,    no masses, no organomegaly  Genitalia:    deferred  Rectal:    deferred  Extremities:   Extremities normal, atraumatic, no cyanosis or edema  Pulses:   2+ and symmetric all extremities  Skin:   Skin color, texture, turgor normal, no rashes or lesions  Lymph nodes:   Cervical, supraclavicular, and axillary nodes normal  Neurologic:   CNII-XII intact. Normal strength, sensation and reflexes      throughout    Depression  Screen PHQ 2/9 Scores 09/27/2016 07/13/2015  PHQ - 2 Score 0 0  PHQ- 9 Score 0 2   Current Exercise Habits: The patient does not participate in regular exercise at present Exercise limited by: None identified     Assessment & Plan:     Routine Health Maintenance and Physical Exam  Exercise Activities and Dietary recommendations Goals    None      Immunization History  Administered Date(s) Administered  . Tdap 12/20/2009    Health Maintenance  Topic Date Due  . HIV Screening  09/01/1981  . COLONOSCOPY  09/01/2016  . INFLUENZA VACCINE  04/20/2017 (Originally 03/21/2016)  . TETANUS/TDAP  12/21/2019     Discussed health benefits of physical activity, and encouraged him to engage in regular exercise appropriate for his age and condition.    --------------------------------------------------------------------  1. Annual physical exam  - Comprehensive metabolic panel - Lipid panel - EKG 12-Lead  2. Colon cancer screening  - Ambulatory referral to Gastroenterology  3. Prostate cancer screening  - PSA  4. Essential hypertension Discussed  diet and exercise. Info on DASH diet, start amlodipine 5mg  daily - EKG 12-Lead - amLODipine (NORVASC) 5 MG tablet; Take 1 tablet (5 mg total) by mouth daily.  Dispense: 30 tablet; Refill: 2  5. Need for influenza vaccination Recommended flu vaccine which patient refused.   6. ED (erectile dysfunction) of organic origin Doing well with prn Cialis.  - tadalafil (CIALIS) 20 MG tablet; Take 1 tablet (20 mg total) by mouth as needed. No more than one in a day  Dispense: 10 tablet; Refill: 4   [name], MD  [facility]

## 2016-09-29 ENCOUNTER — Telehealth: Payer: Self-pay

## 2016-09-29 LAB — COMPREHENSIVE METABOLIC PANEL
A/G RATIO: 1.9 (ref 1.2–2.2)
ALK PHOS: 68 IU/L (ref 39–117)
ALT: 16 IU/L (ref 0–44)
AST: 18 IU/L (ref 0–40)
Albumin: 4.4 g/dL (ref 3.5–5.5)
BILIRUBIN TOTAL: 0.7 mg/dL (ref 0.0–1.2)
BUN/Creatinine Ratio: 10 (ref 9–20)
BUN: 11 mg/dL (ref 6–24)
CHLORIDE: 100 mmol/L (ref 96–106)
CO2: 27 mmol/L (ref 18–29)
Calcium: 8.8 mg/dL (ref 8.7–10.2)
Creatinine, Ser: 1.08 mg/dL (ref 0.76–1.27)
GFR calc Af Amer: 92 mL/min/{1.73_m2} (ref >59)
GFR calc non Af Amer: 80 mL/min/{1.73_m2} (ref >59)
GLOBULIN, TOTAL: 2.3 g/dL (ref 1.5–4.5)
Glucose: 92 mg/dL (ref 65–99)
POTASSIUM: 3.6 mmol/L (ref 3.5–5.2)
SODIUM: 144 mmol/L (ref 134–144)
Total Protein: 6.7 g/dL (ref 6.0–8.5)

## 2016-09-29 LAB — LIPID PANEL
CHOLESTEROL TOTAL: 181 mg/dL (ref 100–199)
Chol/HDL Ratio: 3.8 ratio units (ref 0.0–5.0)
HDL: 48 mg/dL (ref >39)
LDL CALC: 109 mg/dL — AB (ref 0–99)
Triglycerides: 120 mg/dL (ref 0–149)
VLDL Cholesterol Cal: 24 mg/dL (ref 5–40)

## 2016-09-29 LAB — PSA: PROSTATE SPECIFIC AG, SERUM: 1.6 ng/mL (ref 0.0–4.0)

## 2016-09-29 NOTE — Telephone Encounter (Signed)
Advised pt of lab results. Pt verbally acknowledges understanding. Jden Want Drozdowski, CMA   

## 2016-09-29 NOTE — Telephone Encounter (Signed)
-----   Message from Malva Limesonald E Fisher, MD sent at 09/29/2016  8:07 AM EST ----- PSA, blood sugar, kidney functions, electrolytes and cholesterol are all normal.  Continue amlodipine. Follow up in March for blood pressure check as scheduled.

## 2016-10-25 ENCOUNTER — Encounter: Payer: Self-pay | Admitting: Family Medicine

## 2016-10-25 ENCOUNTER — Ambulatory Visit (INDEPENDENT_AMBULATORY_CARE_PROVIDER_SITE_OTHER): Payer: 59 | Admitting: Family Medicine

## 2016-10-25 VITALS — BP 128/84 | HR 88 | Temp 98.9°F | Resp 16 | Wt 213.0 lb

## 2016-10-25 DIAGNOSIS — I10 Essential (primary) hypertension: Secondary | ICD-10-CM | POA: Diagnosis not present

## 2016-10-25 NOTE — Progress Notes (Signed)
       Patient: Andrew Green Male    DOB: 1966-04-29   51 y.o.   MRN: [id number] Visit Date: 10/25/2016  Today's Provider: Mila Merryonald Fisher, MD   Chief Complaint  Patient presents with  . Hypertension    4 week follow up   Subjective:    HPI  Hypertension, follow-up:  BP Readings from Last 3 Encounters:  10/25/16 128/84  09/27/16 (!) 144/100  03/29/16 (!) 140/96    He was last seen for hypertension 4 weeks ago.  BP at that visit was 144/100. Management since that visit includes starting Amlodipine 2.5mg  daily. Patient was also advised to die and exercise. Information on DASH diet given to patient. He reports good compliance with treatment. He is not having side effects. He is not exercising. He is adherent to low salt diet.   Outside blood pressures are checked occasionally. He is experiencing none.  Patient denies exertional chest pressure/discomfort, lower extremity edema and palpitations.       Weight trend: stable Wt Readings from Last 3 Encounters:  10/25/16 213 lb (96.6 kg)  09/27/16 211 lb (95.7 kg)  03/29/16 209 lb (94.8 kg)    Current diet: well balanced     No Known Allergies   Current Outpatient Prescriptions:  .  amLODipine (NORVASC) 5 MG tablet, Take 1 tablet (5 mg total) by mouth daily., Disp: 30 tablet, Rfl: 2 .  tadalafil (CIALIS) 20 MG tablet, Take 1 tablet (20 mg total) by mouth as needed. No more than one in a day, Disp: 10 tablet, Rfl: 4  Review of Systems  Constitutional: Negative for appetite change, chills and fever.  Respiratory: Negative for chest tightness, shortness of breath and wheezing.   Cardiovascular: Negative for chest pain and palpitations.  Gastrointestinal: Negative for abdominal pain, nausea and vomiting.    Social History  Substance Use Topics  . Smoking status: Never Smoker  . Smokeless tobacco: Never Used  . Alcohol use No   Objective:   BP 128/84 (BP Location: Left Arm, Patient Position: Sitting, Cuff Size:  Normal)   Pulse 88   Temp 98.9 F (37.2 C)   Resp 16   Wt 213 lb (96.6 kg)   SpO2 98%   BMI 29.92 kg/m     Physical Exam   General Appearance:    Alert, cooperative, no distress  Eyes:    PERRL, conjunctiva/corneas clear, EOM's intact       Lungs:     Clear to auscultation bilaterally, respirations unlabored  Heart:    Regular rate and rhythm  Neurologic:   Awake, alert, oriented x 3. No apparent focal neurological           defect.           Assessment & Plan:     1. Essential hypertension Much better since addition of amlodipine. Continue current medications.  Return in about 6 months (around 04/27/2017).'       Mila Merryonald Fisher, MD  [facility]

## 2016-12-14 LAB — HM COLONOSCOPY

## 2016-12-27 ENCOUNTER — Encounter: Payer: Self-pay | Admitting: Family Medicine

## 2016-12-27 DIAGNOSIS — Z86010 Personal history of colonic polyps: Secondary | ICD-10-CM | POA: Insufficient documentation

## 2016-12-29 ENCOUNTER — Other Ambulatory Visit: Payer: Self-pay | Admitting: Family Medicine

## 2016-12-29 DIAGNOSIS — I10 Essential (primary) hypertension: Secondary | ICD-10-CM

## 2017-01-08 ENCOUNTER — Encounter: Payer: Self-pay | Admitting: Family Medicine

## 2017-04-27 ENCOUNTER — Encounter: Payer: Self-pay | Admitting: Family Medicine

## 2017-04-27 ENCOUNTER — Ambulatory Visit (INDEPENDENT_AMBULATORY_CARE_PROVIDER_SITE_OTHER): Payer: BLUE CROSS/BLUE SHIELD | Admitting: Family Medicine

## 2017-04-27 VITALS — BP 120/80 | HR 85 | Temp 98.7°F | Wt 214.0 lb

## 2017-04-27 DIAGNOSIS — I10 Essential (primary) hypertension: Secondary | ICD-10-CM

## 2017-04-27 DIAGNOSIS — M79602 Pain in left arm: Secondary | ICD-10-CM | POA: Diagnosis not present

## 2017-04-27 DIAGNOSIS — N529 Male erectile dysfunction, unspecified: Secondary | ICD-10-CM

## 2017-04-27 MED ORDER — SILDENAFIL CITRATE 100 MG PO TABS: 50.0000 mg | ORAL_TABLET | Freq: Every day | ORAL | 11 refills | Status: DC | PRN

## 2017-04-27 NOTE — Progress Notes (Signed)
Patient: Andrew Green Male    DOB: 03-10-1966   51 y.o.   MRN: [id number] Visit Date: 04/27/2017  Today's Provider: [name], MD   Chief Complaint  Patient presents with  . Hypertension    follow up   Subjective:    HPI  Hypertension, follow-up:  BP Readings from Last 3 Encounters:  10/25/16 128/84  09/27/16 (!) 144/100  03/29/16 (!) 140/96    He was last seen for hypertension 6 months ago.  BP at that visit was 128/84. Management since that visit includes no changes. He reports good compliance with treatment. He is not having side effects.  He is not exercising. He is adherent to low salt diet.   Outside blood pressures are not being checked. He is experiencing none.  Patient denies chest pain, chest pressure/discomfort, claudication, dyspnea, exertional chest pressure/discomfort, fatigue, irregular heart beat, lower extremity edema, near-syncope, orthopnea, palpitations, paroxysmal nocturnal dyspnea, syncope and tachypnea.   Cardiovascular risk factors include hypertension and male gender.  Use of agents associated with hypertension: none.     Weight trend: stable Wt Readings from Last 3 Encounters:  10/25/16 213 lb (96.6 kg)  09/27/16 211 lb (95.7 kg)  03/29/16 209 lb (94.8 kg)    Current diet: in general, an "unhealthy" diet  ------------------------------------------------------------------------  He also states he has had a heavy feeling and soreness left lateral arm for about two weeks. He does work with a Information systems manager.   He also states he has changed insurance to [employer] which no longer covers Cialis and he wants prescription for a different medication.     No Known Allergies   Current Outpatient Prescriptions:  .  amLODipine (NORVASC) 5 MG tablet, TAKE 1 TABLET(5 MG) BY MOUTH DAILY, Disp: 30 tablet, Rfl: 5 .  tadalafil (CIALIS) 20 MG tablet, Take 1 tablet (20 mg total) by mouth as needed. No more than one in a day, Disp: 10  tablet, Rfl: 4  Review of Systems  Constitutional: Negative for appetite change, chills and fever.  Respiratory: Negative for chest tightness, shortness of breath and wheezing.   Cardiovascular: Negative for chest pain and palpitations.  Gastrointestinal: Negative for abdominal pain, nausea and vomiting.  Neurological: Positive for numbness (occasional tingling in hands).    Social History  Substance Use Topics  . Smoking status: Never Smoker  . Smokeless tobacco: Never Used  . Alcohol use No   Objective:   BP 120/80 (BP Location: Right Arm, Patient Position: Sitting, Cuff Size: Large)   Pulse 85   Temp 98.7 F (37.1 C) (Oral)   Wt 214 lb (97.1 kg)   SpO2 96% Comment: room air  BMI 30.06 kg/m  There were no vitals filed for this visit.   Physical Exam   General Appearance:    Alert, cooperative, no distress  Eyes:    PERRL, conjunctiva/corneas clear, EOM's intact       Lungs:     Clear to auscultation bilaterally, respirations unlabored  Heart:    Regular rate and rhythm  Neurologic:   Awake, alert, oriented x 3. No apparent focal neurological           defect.   MS:   Slight tenderness mid shaft of left lateral upper arm. No muscle tenderness or weakness, FROM of shoulder.        Assessment & Plan:     1. Essential hypertension Well controlled.  Continue current medications.    2. ED (  erectile dysfunction) of organic origin Change Cialis to sildenafil due to insurance change.   3. Pain of left upper extremity Apply ice qhs call if not better in 2-3 weeks or if sx change or worsen.   Return in about 5 months (around 09/27/2017) for Yearly Physical.       Mila Merryonald Isadore Bokhari, MD  [facility]

## 2017-04-30 ENCOUNTER — Telehealth: Payer: Self-pay | Admitting: Family Medicine

## 2017-04-30 NOTE — Telephone Encounter (Signed)
If they like we can prescribe  sildenafil which he will have to pay out of pocket, but is a fraction of the cost of viagra. I would recommend checking with a few different pharmacies as the price can vary quite a bit. Let me know if they want to try this and which pharmacy to use if they do.

## 2017-04-30 NOTE — Telephone Encounter (Signed)
Patient's wife came by to say that [employer] is not covering Viagra or Cialis and patient is upset about this.   Wants to know what he can do to get ins. To cover this medicine.   Please call Nikos at [phone]

## 2017-04-30 NOTE — Telephone Encounter (Signed)
Please advise 

## 2017-05-01 MED ORDER — TADALAFIL 20 MG PO TABS: 10.0000 mg | ORAL_TABLET | ORAL | 3 refills | Status: DC | PRN

## 2017-05-01 NOTE — Telephone Encounter (Signed)
Patient was notified. Patient stated that he called his insurance company back and asked them what they will cover. They told pt that id pcp will do PA on Cialis 20 mg ans it's approved, they will cover that. Please advise?

## 2017-05-22 ENCOUNTER — Telehealth: Payer: Self-pay

## 2017-05-22 NOTE — Telephone Encounter (Signed)
Patient called office requesting update on pre-authorization. KW

## 2017-05-23 NOTE — Telephone Encounter (Signed)
PA for cialis 20 mg appproved. Pt notified.

## 2017-06-21 ENCOUNTER — Other Ambulatory Visit: Payer: Self-pay | Admitting: Family Medicine

## 2017-06-21 DIAGNOSIS — I10 Essential (primary) hypertension: Secondary | ICD-10-CM

## 2017-07-09 ENCOUNTER — Ambulatory Visit: Payer: BLUE CROSS/BLUE SHIELD | Admitting: Family Medicine

## 2017-07-09 ENCOUNTER — Encounter: Payer: Self-pay | Admitting: Family Medicine

## 2017-07-09 VITALS — BP 128/92 | HR 108 | Temp 98.4°F | Resp 18 | Wt 213.0 lb

## 2017-07-09 DIAGNOSIS — J069 Acute upper respiratory infection, unspecified: Secondary | ICD-10-CM

## 2017-07-09 DIAGNOSIS — J029 Acute pharyngitis, unspecified: Secondary | ICD-10-CM

## 2017-07-09 LAB — POCT RAPID STREP A (OFFICE): RAPID STREP A SCREEN: NEGATIVE

## 2017-07-09 NOTE — Progress Notes (Signed)
Patient: Andrew Green Male    DOB: 12-Sep-1965   51 y.o.   MRN: [id number] Visit Date: 07/09/2017  Today's Provider: Mila Merryonald Fisher, MD   Chief Complaint  Patient presents with  . Sore Throat    x 2 days   Subjective:    Sore Throat   This is a new problem. Episode onset: 2 days ago. The problem has been gradually improving. Sore throat worse side: symetric. Maximum temperature: low grade fever of 99.5. Associated symptoms include congestion, coughing and a hoarse voice. Pertinent negatives include no abdominal pain, shortness of breath or vomiting. He has had no exposure to strep or mono. Treatments tried: OTC DayQuil, and Claritin. The treatment provided no relief.       No Known Allergies   Current Outpatient Medications:  .  amLODipine (NORVASC) 5 MG tablet, TAKE 1 TABLET(5 MG) BY MOUTH DAILY, Disp: 30 tablet, Rfl: 11 .  tadalafil (CIALIS) 20 MG tablet, Take 0.5-1 tablets (10-20 mg total) by mouth every other day as needed for erectile dysfunction., Disp: 8 tablet, Rfl: 3  Review of Systems  Constitutional: Positive for fever. Negative for appetite change and chills.  HENT: Positive for congestion, hoarse voice, postnasal drip and sore throat.   Respiratory: Positive for cough. Negative for chest tightness, shortness of breath and wheezing.   Cardiovascular: Negative for chest pain and palpitations.  Gastrointestinal: Negative for abdominal pain, nausea and vomiting.    Social History   Tobacco Use  . Smoking status: Never Smoker  . Smokeless tobacco: Never Used  Substance Use Topics  . Alcohol use: No    Alcohol/week: 0.0 oz   Objective:   BP (!) 128/92 (BP Location: Left Arm, Patient Position: Sitting, Cuff Size: Large)   Pulse (!) 108   Temp 98.4 F (36.9 C) (Oral)   Resp 18   Wt 213 lb (96.6 kg)   SpO2 98% Comment: room air  BMI 29.92 kg/m  There were no vitals filed for this visit.   Physical Exam  General Appearance:    Alert,  cooperative, no distress  HENT:   bilateral TM normal without fluid or infection, neck without nodes, pharynx erythematous without exudate and nasal mucosa pale and congested  Eyes:    PERRL, conjunctiva/corneas clear, EOM's intact       Lungs:     Clear to auscultation bilaterally, respirations unlabored  Heart:    Regular rate and rhythm  Neurologic:   Awake, alert, oriented x 3. No apparent focal neurological           defect.       Results for orders placed or performed in visit on 07/09/17  POCT rapid strep A  Result Value Ref Range   Rapid Strep A Screen Negative Negative       Assessment & Plan:     1. Sore throat  - POCT rapid strep A  2. Upper respiratory tract infection, unspecified type Counseled regarding signs and symptoms of viral and bacterial respiratory infections. Advised to call or return for additional evaluation if he develops any sign of bacterial infection, or if current symptoms last longer than 10 days.       The entirety of the information documented in the History of Present Illness, Review of Systems and Physical Exam were personally obtained by me. Portions of this information were initially documented by Awilda Billoshena Chambers, CMA and reviewed by me for thoroughness and accuracy.    Mila Merryonald Fisher,  MD  [facility]

## 2017-07-09 NOTE — Patient Instructions (Signed)
   You can take OTC Mucinex (guaifenesin) for chest or sinus congestion and to help your cough      Upper Respiratory Infection, Adult Most upper respiratory infections (URIs) are caused by a virus. A URI affects the nose, throat, and upper air passages. The most common type of URI is often called "the common cold." Follow these instructions at home:  Take medicines only as told by your doctor.  Gargle warm saltwater or take cough drops to comfort your throat as told by your doctor.  Use a warm mist humidifier or inhale steam from a shower to increase air moisture. This may make it easier to breathe.  Drink enough fluid to keep your pee (urine) clear or pale yellow.  Eat soups and other clear broths.  Have a healthy diet.  Rest as needed.  Go back to work when your fever is gone or your doctor says it is okay. ? You may need to stay home longer to avoid giving your URI to others. ? You can also wear a face mask and wash your hands often to prevent spread of the virus.  Use your inhaler more if you have asthma.  Do not use any tobacco products, including cigarettes, chewing tobacco, or electronic cigarettes. If you need help quitting, ask your doctor. Contact a doctor if:  You are getting worse, not better.  Your symptoms are not helped by medicine.  You have chills.  You are getting more short of breath.  You have brown or red mucus.  You have yellow or brown discharge from your nose.  You have pain in your face, especially when you bend forward.  You have a fever.  You have puffy (swollen) neck glands.  You have pain while swallowing.  You have white areas in the back of your throat. Get help right away if:  You have very bad or constant: ? Headache. ? Ear pain. ? Pain in your forehead, behind your eyes, and over your cheekbones (sinus pain). ? Chest pain.  You have long-lasting (chronic) lung disease and any of the following: ? Wheezing. ? Long-lasting  cough. ? Coughing up blood. ? A change in your usual mucus.  You have a stiff neck.  You have changes in your: ? Vision. ? Hearing. ? Thinking. ? Mood. This information is not intended to replace advice given to you by your health care provider. Make sure you discuss any questions you have with your health care provider. Document Released: 01/24/2008 Document Revised: 04/09/2016 Document Reviewed: 11/12/2013 Elsevier Interactive Patient Education  2018 [employer]

## 2017-09-28 ENCOUNTER — Ambulatory Visit (INDEPENDENT_AMBULATORY_CARE_PROVIDER_SITE_OTHER): Payer: BLUE CROSS/BLUE SHIELD | Admitting: Family Medicine

## 2017-09-28 ENCOUNTER — Encounter: Payer: Self-pay | Admitting: Family Medicine

## 2017-09-28 VITALS — BP 128/78 | HR 98 | Temp 98.6°F | Resp 16 | Ht 70.0 in | Wt 212.0 lb

## 2017-09-28 DIAGNOSIS — Z0000 Encounter for general adult medical examination without abnormal findings: Secondary | ICD-10-CM | POA: Diagnosis not present

## 2017-09-28 DIAGNOSIS — K219 Gastro-esophageal reflux disease without esophagitis: Secondary | ICD-10-CM | POA: Diagnosis not present

## 2017-09-28 DIAGNOSIS — E291 Testicular hypofunction: Secondary | ICD-10-CM

## 2017-09-28 DIAGNOSIS — I10 Essential (primary) hypertension: Secondary | ICD-10-CM

## 2017-09-28 DIAGNOSIS — R5383 Other fatigue: Secondary | ICD-10-CM | POA: Diagnosis not present

## 2017-09-28 DIAGNOSIS — L989 Disorder of the skin and subcutaneous tissue, unspecified: Secondary | ICD-10-CM | POA: Diagnosis not present

## 2017-09-28 DIAGNOSIS — Z125 Encounter for screening for malignant neoplasm of prostate: Secondary | ICD-10-CM

## 2017-09-28 DIAGNOSIS — Z23 Encounter for immunization: Secondary | ICD-10-CM

## 2017-09-28 NOTE — Patient Instructions (Addendum)
The CDC recommends two doses of Shingrix (the shingles vaccine) separated by 2 to 6 months for adults age 51 years and older. I recommend checking with your insurance plan regarding coverage for this vaccine.    Please call back for referral to a dermatologist to evaluation lesion on your left leg.     Preventive Care 40-64 Years, Male Preventive care refers to lifestyle choices and visits with your health care provider that can promote health and wellness. What does preventive care include?  A yearly physical exam. This is also called an annual well check.  Dental exams once or twice a year.  Routine eye exams. Ask your health care provider how often you should have your eyes checked.  Personal lifestyle choices, including: ? Daily care of your teeth and gums. ? Regular physical activity. ? Eating a healthy diet. ? Avoiding tobacco and drug use. ? Limiting alcohol use. ? Practicing safe sex. ? Taking low-dose aspirin every day starting at age 35. What happens during an annual well check? The services and screenings done by your health care provider during your annual well check will depend on your age, overall health, lifestyle risk factors, and family history of disease. Counseling Your health care provider may ask you questions about your:  Alcohol use.  Tobacco use.  Drug use.  Emotional well-being.  Home and relationship well-being.  Sexual activity.  Eating habits.  Work and work Statistician.  Screening You may have the following tests or measurements:  Height, weight, and BMI.  Blood pressure.  Lipid and cholesterol levels. These may be checked every 5 years, or more frequently if you are over 104 years old.  Skin check.  Lung cancer screening. You may have this screening every year starting at age 17 if you have a 30-pack-year history of smoking and currently smoke or have quit within the past 15 years.  Fecal occult blood test (FOBT) of the stool.  You may have this test every year starting at age 89.  Flexible sigmoidoscopy or colonoscopy. You may have a sigmoidoscopy every 5 years or a colonoscopy every 10 years starting at age 59.  Prostate cancer screening. Recommendations will vary depending on your family history and other risks.  Hepatitis C blood test.  Hepatitis B blood test.  Sexually transmitted disease (STD) testing.  Diabetes screening. This is done by checking your blood sugar (glucose) after you have not eaten for a while (fasting). You may have this done every 1-3 years.  Discuss your test results, treatment options, and if necessary, the need for more tests with your health care provider. Vaccines Your health care provider may recommend certain vaccines, such as:  Influenza vaccine. This is recommended every year.  Tetanus, diphtheria, and acellular pertussis (Tdap, Td) vaccine. You may need a Td booster every 10 years.  Varicella vaccine. You may need this if you have not been vaccinated.  Zoster vaccine. You may need this after age 13.  Measles, mumps, and rubella (MMR) vaccine. You may need at least one dose of MMR if you were born in 1957 or later. You may also need a second dose.  Pneumococcal 13-valent conjugate (PCV13) vaccine. You may need this if you have certain conditions and have not been vaccinated.  Pneumococcal polysaccharide (PPSV23) vaccine. You may need one or two doses if you smoke cigarettes or if you have certain conditions.  Meningococcal vaccine. You may need this if you have certain conditions.  Hepatitis A vaccine. You may need  this if you have certain conditions or if you travel or work in places where you may be exposed to hepatitis A.  Hepatitis B vaccine. You may need this if you have certain conditions or if you travel or work in places where you may be exposed to hepatitis B.  Haemophilus influenzae type b (Hib) vaccine. You may need this if you have certain risk  factors.  Talk to your health care provider about which screenings and vaccines you need and how often you need them. This information is not intended to replace advice given to you by your health care provider. Make sure you discuss any questions you have with your health care provider. Document Released: 09/03/2015 Document Revised: 04/26/2016 Document Reviewed: 06/08/2015 Elsevier Interactive Patient Education  2018 Elsevier Inc.  

## 2017-09-28 NOTE — Progress Notes (Signed)
Patient: Andrew Green, Male    DOB: 02-05-1966, 52 y.o.   MRN: [id number] Visit Date: 09/28/2017  Today's Provider: [name], MD   Chief Complaint  Patient presents with  . Annual Exam  . Hypertension  . Erectile Dysfunction   Subjective:    Annual physical exam Andrew Green is a 52 y.o. male who presents today for health maintenance and complete physical. He feels fairly well. He reports exercising daily at work (walks about 10,000 steps daily). He reports he is sleeping fairly well.  -----------------------------------------------------------------  Hypertension, follow-up:  BP Readings from Last 3 Encounters:  07/09/17 (!) 128/92  04/27/17 120/80  10/25/16 128/84    He was last seen for hypertension 5 months ago.  BP at that visit was 120/80. Management since that visit includes no changes. He reports good compliance with treatment. He is not having side effects.  He is exercising. He is not adherent to low salt diet.   Outside blood pressures are not being checked. He is experiencing none.  Patient denies chest pain, chest pressure/discomfort, claudication, dyspnea, exertional chest pressure/discomfort, fatigue, irregular heart beat, lower extremity edema, near-syncope, orthopnea, palpitations, paroxysmal nocturnal dyspnea, syncope and tachypnea.   Cardiovascular risk factors include hypertension and male gender.  Use of agents associated with hypertension: none.     Weight trend: stable Wt Readings from Last 3 Encounters:  07/09/17 213 lb (96.6 kg)  04/27/17 214 lb (97.1 kg)  10/25/16 213 lb (96.6 kg)    Current diet: well balanced  ------------------------------------------------------------------------ Follow up of Erectile Dysfunction:  Patient was last seen for this problem 5 months ago. Changes during that visit includes changing to Cialis to Sildenafil. Since last visit, patient was changed back to Cialis due to insurance coverage.     No regular exercise, but walks all day for work  Review of Systems  Constitutional: Negative for appetite change, chills, fatigue and fever.  HENT: Negative for congestion, ear pain, hearing loss, nosebleeds and trouble swallowing.   Eyes: Negative for pain and visual disturbance.  Respiratory: Negative for cough, chest tightness and shortness of breath.   Cardiovascular: Negative for chest pain, palpitations and leg swelling.  Gastrointestinal: Negative for abdominal pain, blood in stool, constipation, diarrhea, nausea and vomiting.  Endocrine: Negative for polydipsia, polyphagia and polyuria.  Genitourinary: Negative for dysuria and flank pain.  Musculoskeletal: Negative for arthralgias, back pain, joint swelling, myalgias and neck stiffness.  Skin: Negative for color change, rash and wound.  Neurological: Positive for headaches. Negative for dizziness, tremors, seizures, speech difficulty, weakness and light-headedness.  Psychiatric/Behavioral: Negative for behavioral problems, confusion, decreased concentration, dysphoric mood and sleep disturbance. The patient is not nervous/anxious.   All other systems reviewed and are negative.   Social History      He  reports that  has never smoked. he has never used smokeless tobacco. He reports that he does not drink alcohol or use drugs.       Social History   Socioeconomic History  . Marital status: Married    Spouse name: Not on file  . Number of children: 2  . Years of education: Not on file  . Highest education level: Not on file  Social Needs  . Financial resource strain: Not on file  . Food insecurity - worry: Not on file  . Food insecurity - inability: Not on file  . Transportation needs - medical: Not on file  . Transportation needs - non-medical: Not on  file  Occupational History  . Occupation: Chartered certified accountantMachinist  Tobacco Use  . Smoking status: Never Smoker  . Smokeless tobacco: Never Used  Substance and Sexual Activity  .  Alcohol use: No    Alcohol/week: 0.0 oz  . Drug use: No  . Sexual activity: Not on file  Other Topics Concern  . Not on file  Social History Narrative  . Not on file    Past Medical History:  Diagnosis Date  . History of chicken pox   . History of measles   . History of mumps      Patient Active Problem List   Diagnosis Date Noted  . History of adenomatous polyp of colon 12/27/2016  . Neuralgia 07/27/2015  . Allergic rhinitis 07/13/2015  . Essential hypertension 07/13/2015  . GERD (gastroesophageal reflux disease) 07/13/2015  . Testicular hypofunction 02/07/2010  . ED (erectile dysfunction) of organic origin 12/20/2009    Past Surgical History:  Procedure Laterality Date  . HERNIA REPAIR  1970    Family History        Family Status  Relation Name Status  . Mother  Alive  . Father  Alive  . Sister  Alive  . MGM  Deceased  . PGM  Deceased  . Daughter  Alive  . Daughter  Alive  . Neg Hx  (Not Specified)        His family history includes Brain cancer in his maternal grandmother; Cervical cancer in his mother; Colon polyps in his father; Congestive Heart Failure in his paternal grandmother; Lung cancer in his maternal grandmother. There is no history of CAD.      No Known Allergies   Current Outpatient Medications:  .  amLODipine (NORVASC) 5 MG tablet, TAKE 1 TABLET(5 MG) BY MOUTH DAILY, Disp: 30 tablet, Rfl: 11 .  tadalafil (CIALIS) 20 MG tablet, Take 0.5-1 tablets (10-20 mg total) by mouth every other day as needed for erectile dysfunction., Disp: 8 tablet, Rfl: 3   Patient Care Team: Malva LimesFisher, Seniya Stoffers E, MD as PCP - General (Family Medicine) Deirdre EvenerKowalski, David C, MD (Dermatology)      Objective:   Vitals: BP 128/78 (BP Location: Right Arm, Patient Position: Sitting, Cuff Size: Large)   Pulse 98   Temp 98.6 F (37 C) (Oral)   Resp 16   Ht 5\' 10"  (1.778 m)   Wt 212 lb (96.2 kg)   SpO2 97% Comment: room air  BMI 30.42 kg/m      Physical  Exam   General Appearance:    Alert, cooperative, no distress, appears stated age  Head:    Normocephalic, without obvious abnormality, atraumatic  Eyes:    PERRL, conjunctiva/corneas clear, EOM's intact, fundi    benign, both eyes       Ears:    Normal TM's and external ear canals, both ears  Nose:   Nares normal, septum midline, mucosa normal, no drainage   or sinus tenderness  Throat:   Lips, mucosa, and tongue normal; teeth and gums normal  Neck:   Supple, symmetrical, trachea midline, no adenopathy;       thyroid:  No enlargement/tenderness/nodules; no carotid   bruit or JVD  Back:     Symmetric, no curvature, ROM normal, no CVA tenderness  Lungs:     Clear to auscultation bilaterally, respirations unlabored  Chest wall:    No tenderness or deformity  Heart:    Regular rate and rhythm, S1 and S2 normal, no murmur, rub   or gallop  Abdomen:     Soft, non-tender, bowel sounds active all four quadrants,    no masses, no organomegaly  Genitalia:    deferred  Rectal:    deferred  Extremities:   Extremities normal, atraumatic, no cyanosis or edema  Pulses:   2+ and symmetric all extremities  Skin:   Skin color, texture, turgor normal, no rashes or lesions  Lymph nodes:   Cervical, supraclavicular, and axillary nodes normal  Neurologic:   CNII-XII intact. Normal strength, sensation and reflexes      throughout    Depression Screen PHQ 2/9 Scores 09/28/2017 09/27/2016 07/13/2015  PHQ - 2 Score 0 0 0  PHQ- 9 Score 2 0 2      Assessment & Plan:     Routine Health Maintenance and Physical Exam  Exercise Activities and Dietary recommendations Goals    None      Immunization History  Administered Date(s) Administered  . Influenza-Unspecified 06/15/2017  . Tdap 12/20/2009    Health Maintenance  Topic Date Due  . HIV Screening  09/01/1981  . TETANUS/TDAP  12/21/2019  . COLONOSCOPY  12/14/2021  . INFLUENZA VACCINE  Completed     Discussed health benefits of physical  activity, and encouraged him to engage in regular exercise appropriate for his age and condition.    --------------------------------------------------------------------  1. Annual physical exam Generally doing well with normal exam.  - Lipid panel - Comprehensive metabolic panel  2. Essential hypertension Well controlled.  Continue current medications.   - Lipid panel - Comprehensive metabolic panel  3. Gastroesophageal reflux disease, esophagitis presence not specified Well controlled.  Continue current medications.    4. Other fatigue  - Comprehensive metabolic panel - CBC - TSH  5. Non-healing skin lesion He reports sore on his leg that has been present for nearly a year and has not healed. Will refer to dermatology to evaluation.   6. Need for shingles vaccine Counseled regarding shingrix and he is going to think about it.   7. Hypogonadism in male Has been off testosterone for several years, but feels he may need to get back on testosterone replacement.  - Testosterone,Free and Total  8. Prostate cancer screening  - PSA     [name], MD  [facility]

## 2017-10-04 DIAGNOSIS — R5383 Other fatigue: Secondary | ICD-10-CM | POA: Diagnosis not present

## 2017-10-04 DIAGNOSIS — I10 Essential (primary) hypertension: Secondary | ICD-10-CM | POA: Diagnosis not present

## 2017-10-04 DIAGNOSIS — E291 Testicular hypofunction: Secondary | ICD-10-CM | POA: Diagnosis not present

## 2017-10-04 DIAGNOSIS — Z125 Encounter for screening for malignant neoplasm of prostate: Secondary | ICD-10-CM | POA: Diagnosis not present

## 2017-10-04 DIAGNOSIS — Z0000 Encounter for general adult medical examination without abnormal findings: Secondary | ICD-10-CM | POA: Diagnosis not present

## 2017-10-05 LAB — LIPID PANEL
CHOL/HDL RATIO: 3.4 ratio (ref 0.0–5.0)
CHOLESTEROL TOTAL: 152 mg/dL (ref 100–199)
HDL: 45 mg/dL (ref >39)
LDL CALC: 86 mg/dL (ref 0–99)
TRIGLYCERIDES: 105 mg/dL (ref 0–149)
VLDL Cholesterol Cal: 21 mg/dL (ref 5–40)

## 2017-10-05 LAB — CBC
HEMATOCRIT: 41.1 % (ref 37.5–51.0)
Hemoglobin: 14.8 g/dL (ref 13.0–17.7)
MCH: 30.9 pg (ref 26.6–33.0)
MCHC: 36 g/dL — AB (ref 31.5–35.7)
MCV: 86 fL (ref 79–97)
Platelets: 212 10*3/uL (ref 150–379)
RBC: 4.79 x10E6/uL (ref 4.14–5.80)
RDW: 13.9 % (ref 12.3–15.4)
WBC: 5.4 10*3/uL (ref 3.4–10.8)

## 2017-10-05 LAB — COMPREHENSIVE METABOLIC PANEL
A/G RATIO: 2 (ref 1.2–2.2)
ALT: 17 IU/L (ref 0–44)
AST: 18 IU/L (ref 0–40)
Albumin: 4.4 g/dL (ref 3.5–5.5)
Alkaline Phosphatase: 66 IU/L (ref 39–117)
BILIRUBIN TOTAL: 0.8 mg/dL (ref 0.0–1.2)
BUN/Creatinine Ratio: 11 (ref 9–20)
BUN: 11 mg/dL (ref 6–24)
CALCIUM: 8.9 mg/dL (ref 8.7–10.2)
CO2: 26 mmol/L (ref 20–29)
Chloride: 102 mmol/L (ref 96–106)
Creatinine, Ser: 1.04 mg/dL (ref 0.76–1.27)
GFR, EST AFRICAN AMERICAN: 96 mL/min/{1.73_m2} (ref >59)
GFR, EST NON AFRICAN AMERICAN: 83 mL/min/{1.73_m2} (ref >59)
Globulin, Total: 2.2 g/dL (ref 1.5–4.5)
Glucose: 87 mg/dL (ref 65–99)
POTASSIUM: 3.6 mmol/L (ref 3.5–5.2)
Sodium: 143 mmol/L (ref 134–144)
TOTAL PROTEIN: 6.6 g/dL (ref 6.0–8.5)

## 2017-10-05 LAB — PSA: Prostate Specific Ag, Serum: 4.5 ng/mL — ABNORMAL HIGH (ref 0.0–4.0)

## 2017-10-05 LAB — TESTOSTERONE,FREE AND TOTAL
Testosterone, Free: 5.1 pg/mL — ABNORMAL LOW (ref 7.2–24.0)
Testosterone: 244 ng/dL — ABNORMAL LOW (ref 264–916)

## 2017-10-05 LAB — TSH: TSH: 1.44 u[IU]/mL (ref 0.450–4.500)

## 2017-10-08 ENCOUNTER — Telehealth: Payer: Self-pay | Admitting: Family Medicine

## 2017-10-08 ENCOUNTER — Telehealth: Payer: Self-pay

## 2017-10-08 DIAGNOSIS — E291 Testicular hypofunction: Secondary | ICD-10-CM

## 2017-10-08 DIAGNOSIS — R899 Unspecified abnormal finding in specimens from other organs, systems and tissues: Secondary | ICD-10-CM

## 2017-10-08 NOTE — Telephone Encounter (Signed)
-----   Message from Malva Limesonald E Fisher, MD sent at 10/08/2017  7:59 AM EST ----- PSA level is high. Testosterone is low. Rest of labs are normal.  Need to repeat PSA and testosterone in 2 weeks to see if PSA remains elevated. Cannot take testosterone until PSA is normalized.

## 2017-10-08 NOTE — Telephone Encounter (Signed)
Reviewed over results with patient in detail. KW

## 2017-10-08 NOTE — Telephone Encounter (Signed)
Patient was advised and lab order has been placed in chart as a future order. KW

## 2017-10-08 NOTE — Telephone Encounter (Signed)
Pt stated he was called this morning to discuss his lab results he wasn't advised the actual numbers of his results and would like a call back to get his actual results. Please advise. Thanks TNP

## 2017-10-10 DIAGNOSIS — L738 Other specified follicular disorders: Secondary | ICD-10-CM | POA: Diagnosis not present

## 2017-10-10 DIAGNOSIS — L821 Other seborrheic keratosis: Secondary | ICD-10-CM | POA: Diagnosis not present

## 2017-10-10 DIAGNOSIS — I789 Disease of capillaries, unspecified: Secondary | ICD-10-CM | POA: Diagnosis not present

## 2017-10-10 DIAGNOSIS — D485 Neoplasm of uncertain behavior of skin: Secondary | ICD-10-CM | POA: Diagnosis not present

## 2017-10-10 DIAGNOSIS — L820 Inflamed seborrheic keratosis: Secondary | ICD-10-CM | POA: Diagnosis not present

## 2017-10-10 DIAGNOSIS — Z1283 Encounter for screening for malignant neoplasm of skin: Secondary | ICD-10-CM | POA: Diagnosis not present

## 2017-10-22 ENCOUNTER — Other Ambulatory Visit: Payer: Self-pay | Admitting: Family Medicine

## 2017-10-22 DIAGNOSIS — R6889 Other general symptoms and signs: Secondary | ICD-10-CM | POA: Diagnosis not present

## 2017-10-22 DIAGNOSIS — E291 Testicular hypofunction: Secondary | ICD-10-CM | POA: Diagnosis not present

## 2017-10-23 LAB — PSA: Prostate Specific Ag, Serum: 2.5 ng/mL (ref 0.0–4.0)

## 2017-10-23 LAB — TESTOSTERONE,FREE AND TOTAL
TESTOSTERONE FREE: 6.5 pg/mL — AB (ref 7.2–24.0)
TESTOSTERONE: 290 ng/dL (ref 264–916)

## 2017-10-25 ENCOUNTER — Telehealth: Payer: Self-pay | Admitting: *Deleted

## 2017-10-25 MED ORDER — TESTOSTERONE 2 MG/24HR TD PT24: 1.0000 | MEDICATED_PATCH | Freq: Every day | TRANSDERMAL | 1 refills | Status: DC

## 2017-10-25 NOTE — Telephone Encounter (Signed)
-----   Message from Malva Limesonald E Fisher, MD sent at 10/23/2017  1:21 PM EST ----- PSA is back to normal. Second testosterone measurement is low. Can start Androderm patch 2mg per 24 hhour, apply one patch QHS, #30 patches rf x 1 and schedule follow up in 1 month.

## 2017-10-25 NOTE — Telephone Encounter (Signed)
Patient was advised of results. Expressed understanding. Rx was sent to pharmacy.  

## 2017-11-09 MED ORDER — TESTOSTERONE 20.25 MG/ACT (1.62%) TD GEL: 2.0000 | Freq: Every day | TRANSDERMAL | 3 refills | Status: DC

## 2017-11-09 NOTE — Addendum Note (Signed)
Addended by: Malva LimesFISHER, Cyle Kenyon E on: 11/09/2017 12:41 PM   Modules accepted: Orders

## 2017-11-09 NOTE — Telephone Encounter (Signed)
Change to generic androgel 1.62% due to insurance formulary

## 2018-03-25 ENCOUNTER — Other Ambulatory Visit: Payer: Self-pay | Admitting: Family Medicine

## 2018-03-25 MED ORDER — TADALAFIL 20 MG PO TABS: 10.0000 mg | ORAL_TABLET | ORAL | 3 refills | Status: DC | PRN

## 2018-03-25 NOTE — Telephone Encounter (Signed)
[facility] faxed refill request for the following medications:  tadalafil (CIALIS) 20 MG tablet  Last Rx: 05/01/17 with 3 refills LOV: 09/28/17 Please advise. Thanks TNP

## 2018-03-26 ENCOUNTER — Telehealth: Payer: Self-pay | Admitting: Family Medicine

## 2018-03-26 NOTE — Telephone Encounter (Signed)
[facility] faxed notice that [employer] insurance doesn't cover tadalafil (ADCIRCA/CIALIS) 20 MG tablet and are requesting an Rx for Sildenafil or Vardenafil. Please advise. Thanks TNP

## 2018-03-26 NOTE — Telephone Encounter (Signed)
Please review

## 2018-03-27 MED ORDER — SILDENAFIL CITRATE 100 MG PO TABS: 50.0000 mg | ORAL_TABLET | Freq: Every day | ORAL | 11 refills | Status: DC | PRN

## 2018-03-28 ENCOUNTER — Other Ambulatory Visit: Payer: Self-pay | Admitting: Family Medicine

## 2018-03-28 MED ORDER — VARDENAFIL HCL 20 MG PO TABS: 20.0000 mg | ORAL_TABLET | Freq: Every day | ORAL | 5 refills | Status: DC | PRN

## 2018-04-17 DIAGNOSIS — L720 Epidermal cyst: Secondary | ICD-10-CM | POA: Diagnosis not present

## 2018-05-20 ENCOUNTER — Ambulatory Visit (INDEPENDENT_AMBULATORY_CARE_PROVIDER_SITE_OTHER): Payer: BLUE CROSS/BLUE SHIELD | Admitting: Family Medicine

## 2018-05-20 ENCOUNTER — Encounter: Payer: Self-pay | Admitting: Family Medicine

## 2018-05-20 VITALS — BP 112/68 | HR 88 | Temp 98.4°F | Resp 16 | Wt 212.0 lb

## 2018-05-20 DIAGNOSIS — M545 Low back pain, unspecified: Secondary | ICD-10-CM

## 2018-05-20 DIAGNOSIS — R109 Unspecified abdominal pain: Secondary | ICD-10-CM | POA: Diagnosis not present

## 2018-05-20 LAB — POCT URINALYSIS DIPSTICK
Bilirubin, UA: NEGATIVE
GLUCOSE UA: NEGATIVE
Ketones, UA: NEGATIVE
LEUKOCYTES UA: NEGATIVE
Nitrite, UA: NEGATIVE
Protein, UA: NEGATIVE
Spec Grav, UA: 1.015 (ref 1.010–1.025)
Urobilinogen, UA: 0.2 E.U./dL
pH, UA: 6 (ref 5.0–8.0)

## 2018-05-20 NOTE — Progress Notes (Signed)
Patient: Andrew Green Male    DOB: [birth year]   52 y.o.   MRN: [id number] Visit Date: 05/20/2018  Today's Provider: [name], MD   Chief Complaint  Patient presents with  . Back Pain    x 10 days   Subjective:    Back Pain  This is a new problem. Episode onset: 10 days ago. The problem has been gradually worsening since onset. Pain location: right side. Exacerbated by: certain movements. Pertinent negatives include no abdominal pain, bladder incontinence, bowel incontinence, chest pain, dysuria, fever, headaches, leg pain, numbness, paresis, paresthesias, pelvic pain, perianal numbness, tingling, weakness or weight loss. He has tried nothing for the symptoms.  He has had no known injury. Is not affected by eating or drinking.No blood in urine. No spasm. No fevers, chills or sweats. He has a coworker who had kidney stones who told him his pain was in the exact same area.      No Known Allergies   Current Outpatient Medications:  .  amLODipine (NORVASC) 5 MG tablet, TAKE 1 TABLET(5 MG) BY MOUTH DAILY, Disp: 30 tablet, Rfl: 11 .  sildenafil (VIAGRA) 100 MG tablet, Take 0.5-1 tablets (50-100 mg total) by mouth daily as needed for erectile dysfunction., Disp: 5 tablet, Rfl: 11 .  tadalafil (ADCIRCA/CIALIS) 20 MG tablet, Take 0.5-1 tablets (10-20 mg total) by mouth every other day as needed for erectile dysfunction., Disp: 8 tablet, Rfl: 3 .  Testosterone 20.25 MG/ACT (1.62%) GEL, Place 2 Pump onto the skin daily., Disp: 75 g, Rfl: 3 .  vardenafil (LEVITRA) 20 MG tablet, Take 1 tablet (20 mg total) by mouth daily as needed for erectile dysfunction., Disp: 10 tablet, Rfl: 5  Review of Systems  Constitutional: Negative for fever and weight loss.  Cardiovascular: Negative for chest pain.  Gastrointestinal: Negative for abdominal pain and bowel incontinence.  Genitourinary: Negative for bladder incontinence, dysuria and pelvic pain.  Musculoskeletal: Positive for back pain.    Neurological: Negative for tingling, weakness, numbness, headaches and paresthesias.  Psychiatric/Behavioral: Positive for sleep disturbance.    Social History   Tobacco Use  . Smoking status: Never Smoker  . Smokeless tobacco: Never Used  Substance Use Topics  . Alcohol use: No    Alcohol/week: 0.0 standard drinks   Objective:   BP 112/68 (BP Location: Left Arm, Patient Position: Sitting, Cuff Size: Large)   Pulse 88   Temp 98.4 F (36.9 C) (Oral)   Resp 16   Wt 212 lb (96.2 kg)   SpO2 97% Comment: room air  BMI 30.42 kg/m  Vitals:   05/20/18 1357  BP: 112/68  Pulse: 88  Resp: 16  Temp: 98.4 F (36.9 C)  TempSrc: Oral  SpO2: 97%  Weight: 212 lb (96.2 kg)     Physical Exam  General Appearance:    Alert, cooperative, no distress  Eyes:    PERRL, conjunctiva/corneas clear, EOM's intact       Lungs:     Clear to auscultation bilaterally, respirations unlabored  Heart:    Regular rate and rhythm  Abdomen:   bowel sounds present and normal in all 4 quadrants, soft, round. Slightly tender over rigtht lateral 9th rib. No CVAT. No masses or guarding.    Results for orders placed or performed in visit on 05/20/18  POCT Urinalysis Dipstick  Result Value Ref Range   Color, UA yellow    Clarity, UA clear    Glucose, UA Negative Negative  Bilirubin, UA negative    Ketones, UA negative    Spec Grav, UA 1.015 1.010 - 1.025   Blood, UA Trace (Non-hemolyzed)    pH, UA 6.0 5.0 - 8.0   Protein, UA Negative Negative   Urobilinogen, UA 0.2 0.2 or 1.0 E.U./dL   Nitrite, UA negative    Leukocytes, UA Negative Negative   Appearance     Odor          Assessment & Plan:     1. Acute right-sided low back pain without sciatica  - POCT Urinalysis Dipstick  2. Right flank pain  - US Abdomen Limited; Future        [name], MD  [facility]

## 2018-05-26 ENCOUNTER — Emergency Department
Admission: EM | Admit: 2018-05-26 | Discharge: 2018-05-26 | Disposition: A | Discharge status: Home / Self Care | Payer: BLUE CROSS/BLUE SHIELD | Attending: Emergency Medicine | Admitting: Emergency Medicine

## 2018-05-26 ENCOUNTER — Emergency Department: Payer: BLUE CROSS/BLUE SHIELD

## 2018-05-26 ENCOUNTER — Encounter: Payer: Self-pay | Admitting: Emergency Medicine

## 2018-05-26 ENCOUNTER — Other Ambulatory Visit: Payer: Self-pay

## 2018-05-26 DIAGNOSIS — S61213A Laceration without foreign body of left middle finger without damage to nail, initial encounter: Secondary | ICD-10-CM | POA: Insufficient documentation

## 2018-05-26 DIAGNOSIS — Y939 Activity, unspecified: Secondary | ICD-10-CM | POA: Diagnosis not present

## 2018-05-26 DIAGNOSIS — S62633A Displaced fracture of distal phalanx of left middle finger, initial encounter for closed fracture: Secondary | ICD-10-CM | POA: Diagnosis not present

## 2018-05-26 DIAGNOSIS — S6992XA Unspecified injury of left wrist, hand and finger(s), initial encounter: Secondary | ICD-10-CM | POA: Diagnosis not present

## 2018-05-26 DIAGNOSIS — S61313A Laceration without foreign body of left middle finger with damage to nail, initial encounter: Secondary | ICD-10-CM

## 2018-05-26 DIAGNOSIS — Z79899 Other long term (current) drug therapy: Secondary | ICD-10-CM | POA: Diagnosis not present

## 2018-05-26 DIAGNOSIS — Z23 Encounter for immunization: Secondary | ICD-10-CM | POA: Diagnosis not present

## 2018-05-26 DIAGNOSIS — Y999 Unspecified external cause status: Secondary | ICD-10-CM | POA: Insufficient documentation

## 2018-05-26 DIAGNOSIS — Y929 Unspecified place or not applicable: Secondary | ICD-10-CM | POA: Insufficient documentation

## 2018-05-26 DIAGNOSIS — I10 Essential (primary) hypertension: Secondary | ICD-10-CM | POA: Diagnosis not present

## 2018-05-26 DIAGNOSIS — W230XXA Caught, crushed, jammed, or pinched between moving objects, initial encounter: Secondary | ICD-10-CM | POA: Diagnosis not present

## 2018-05-26 DIAGNOSIS — S62633B Displaced fracture of distal phalanx of left middle finger, initial encounter for open fracture: Secondary | ICD-10-CM

## 2018-05-26 MED ORDER — OXYCODONE-ACETAMINOPHEN 5-325 MG PO TABS: 1.0000 | ORAL_TABLET | ORAL | 0 refills | Status: AC | PRN

## 2018-05-26 MED ORDER — CLINDAMYCIN PHOSPHATE 300 MG/2ML IJ SOLN
INTRAMUSCULAR | Status: AC
  Administered 2018-05-26: 600 mg via INTRAMUSCULAR
  Filled 2018-05-26: qty 4

## 2018-05-26 MED ORDER — BACITRACIN-NEOMYCIN-POLYMYXIN 400-5-5000 EX OINT
TOPICAL_OINTMENT | Freq: Once | CUTANEOUS | Status: AC
  Administered 2018-05-26: 1 via TOPICAL
  Filled 2018-05-26: qty 1

## 2018-05-26 MED ORDER — LIDOCAINE HCL (PF) 1 % IJ SOLN
5.0000 mL | Freq: Once | INTRAMUSCULAR | Status: AC
  Administered 2018-05-26: 5 mL via INTRADERMAL
  Filled 2018-05-26: qty 5

## 2018-05-26 MED ORDER — KETOROLAC TROMETHAMINE 30 MG/ML IJ SOLN
30.0000 mg | Freq: Once | INTRAMUSCULAR | Status: AC
  Administered 2018-05-26: 30 mg via INTRAMUSCULAR
  Filled 2018-05-26: qty 1

## 2018-05-26 MED ORDER — CLINDAMYCIN HCL 300 MG PO CAPS: 300.0000 mg | ORAL_CAPSULE | Freq: Four times a day (QID) | ORAL | 0 refills | Status: AC

## 2018-05-26 MED ORDER — LIDOCAINE HCL (PF) 1 % IJ SOLN
INTRAMUSCULAR | Status: AC
  Filled 2018-05-26: qty 5

## 2018-05-26 MED ORDER — TETANUS-DIPHTH-ACELL PERTUSSIS 5-2.5-18.5 LF-MCG/0.5 IM SUSP
0.5000 mL | Freq: Once | INTRAMUSCULAR | Status: AC
  Administered 2018-05-26: 0.5 mL via INTRAMUSCULAR
  Filled 2018-05-26: qty 0.5

## 2018-05-26 MED ORDER — CLINDAMYCIN PHOSPHATE 600 MG/4ML IJ SOLN
600.0000 mg | Freq: Once | INTRAMUSCULAR | Status: AC
  Administered 2018-05-26: 600 mg via INTRAMUSCULAR
  Filled 2018-05-26 (×2): qty 4

## 2018-05-26 NOTE — ED Provider Notes (Signed)
[facility] Emergency Department Provider Note  ____________________________________________  Time seen: Approximately 11:45 AM  I have reviewed the triage vital signs and the nursing notes.   HISTORY  Chief Complaint Extremity Laceration    HPI Andrew Green is a 52 y.o. male that presents to emergency department for evaluation of left finger laceration after finger was caught in a door frame this morning.  Last tetanus shot is on known.  Past Medical History:  Diagnosis Date  . History of chicken pox   . History of measles   . History of mumps     Patient Active Problem List   Diagnosis Date Noted  . History of adenomatous polyp of colon 12/27/2016  . Neuralgia 07/27/2015  . Allergic rhinitis 07/13/2015  . Essential hypertension 07/13/2015  . GERD (gastroesophageal reflux disease) 07/13/2015  . Testicular hypofunction 02/07/2010  . ED (erectile dysfunction) of organic origin 12/20/2009    Past Surgical History:  Procedure Laterality Date  . HERNIA REPAIR  1970    Prior to Admission medications   Medication Sig Start Date End Date Taking? Authorizing Provider  amLODipine (NORVASC) 5 MG tablet TAKE 1 TABLET(5 MG) BY MOUTH DAILY 06/21/17   Malva Limes, MD  clindamycin (CLEOCIN) 300 MG capsule Take 1 capsule (300 mg total) by mouth 4 (four) times daily for 10 days. 05/26/18 06/05/18  Enid Derry, PA-C  oxyCODONE-acetaminophen (PERCOCET) 5-325 MG tablet Take 1 tablet by mouth every 4 (four) hours as needed for up to 3 days for severe pain. 05/26/18 05/29/18  Enid Derry, PA-C  sildenafil (VIAGRA) 100 MG tablet Take 0.5-1 tablets (50-100 mg total) by mouth daily as needed for erectile dysfunction. 03/27/18   Malva Limes, MD  tadalafil (ADCIRCA/CIALIS) 20 MG tablet Take 0.5-1 tablets (10-20 mg total) by mouth every other day as needed for erectile dysfunction. 03/25/18   Malva Limes, MD  Testosterone 20.25 MG/ACT (1.62%) GEL Place 2  Pump onto the skin daily. 11/09/17   Malva Limes, MD  vardenafil (LEVITRA) 20 MG tablet Take 1 tablet (20 mg total) by mouth daily as needed for erectile dysfunction. 03/28/18   Malva Limes, MD    Allergies Patient has no known allergies.  Family History  Problem Relation Age of Onset  . Cervical cancer Mother   . Colon polyps Father   . Lung cancer Maternal Grandmother   . Brain cancer Maternal Grandmother   . Congestive Heart Failure Paternal Grandmother   . CAD Neg Hx     Social History Social History   Tobacco Use  . Smoking status: Never Smoker  . Smokeless tobacco: Never Used  Substance Use Topics  . Alcohol use: No    Alcohol/week: 0.0 standard drinks  . Drug use: No     Review of Systems  Gastrointestinal: No nausea, no vomiting.  Musculoskeletal: Positive for finger pain.  Skin: Negative for rash, ecchymosis. Positive for laceration.  Neurological: Negative for numbness or tingling   ____________________________________________   PHYSICAL EXAM:  VITAL SIGNS: ED Triage Vitals  Enc Vitals Group     BP 05/26/18 1112 (!) 146/93     Pulse Rate 05/26/18 1112 89     Resp 05/26/18 1112 16     Temp 05/26/18 1112 98.2 F (36.8 C)     Temp Source 05/26/18 1112 Oral     SpO2 05/26/18 1112 95 %     Weight 05/26/18 1113 212 lb (96.2 kg)     Height 05/26/18  1113 5\' 10"  (1.778 m)     Head Circumference --      Peak Flow --      Pain Score 05/26/18 1113 0     Pain Loc --      Pain Edu? --      Excl. in GC? --      Constitutional: Alert and oriented. Well appearing and in no acute distress. Eyes: Conjunctivae are normal. PERRL. EOMI. Head: Atraumatic. ENT:      Ears:      Nose: No congestion/rhinnorhea.      Mouth/Throat: Mucous membranes are moist.  Neck: No stridor.  Cardiovascular: Good peripheral circulation. Respiratory: Good air entry to the bases with no decreased or absent breath sounds. Musculoskeletal: Full range of motion to all  extremities. No gross deformities appreciated. Neurologic:  Normal speech and language. No gross focal neurologic deficits are appreciated.  Skin:  Skin is warm, dry and intact.  1.5 cm laceration to distal left middle finger through nail bed. Psychiatric: Mood and affect are normal. Speech and behavior are normal. Patient exhibits appropriate insight and judgement.   ____________________________________________   LABS (all labs ordered are listed, but only abnormal results are displayed)  Labs Reviewed - No data to display ____________________________________________  EKG   ____________________________________________  RADIOLOGY Lexine Baton, personally viewed and evaluated these images (plain radiographs) as part of my medical decision making, as well as reviewing the written report by the radiologist.  Dg Finger Middle Left  Result Date: 05/26/2018 CLINICAL DATA:  Laceration LEFT middle finger nailbed, caught finger between a door frame and door this morning EXAM: LEFT MIDDLE FINGER 2+V COMPARISON:  None FINDINGS: Soft tissue swelling at distal phalanx LEFT middle finger. Osseous mineralization normal. Joint spaces preserved. Displaced tuft fracture at distal phalanx. No additional fracture, dislocation or bone destruction. IMPRESSION: Displaced tuft fracture at distal phalanx LEFT middle finger. Electronically Signed   By: Ulyses Southward M.D.   On: 05/26/2018 12:01    ____________________________________________    PROCEDURES  Procedure(s) performed:    Procedures  LACERATION REPAIR Performed by: Enid Derry  Consent: Verbal consent obtained.  Consent given by: patient  Prepped and Draped in normal sterile fashion  Wound explored: No foreign bodies   Laceration Location: distal middle finger  Laceration Length: 1.5 cm  Anesthesia: None  Local anesthetic: lidocaine 1% without epinephrine  Anesthetic total: 7 ml  Irrigation method: syringe  Amount of  cleaning: normal saline  Skin closure: 4-0 nylon  Number of sutures: 4  Technique: Simple interrupted  Patient tolerance: Patient tolerated the procedure well with no immediate complications.  Medications  lidocaine (PF) (XYLOCAINE) 1 % injection 5 mL (5 mLs Intradermal Given by Other 05/26/18 1223)  Tdap (BOOSTRIX) injection 0.5 mL (0.5 mLs Intramuscular Given 05/26/18 1225)  clindamycin (CLEOCIN) injection 600 mg (600 mg Intramuscular Given 05/26/18 1348)  ketorolac (TORADOL) 30 MG/ML injection 30 mg (30 mg Intramuscular Given 05/26/18 1345)  neomycin-bacitracin-polymyxin (NEOSPORIN) ointment (1 application Topical Given 05/26/18 1348)     ____________________________________________   INITIAL IMPRESSION / ASSESSMENT AND PLAN / ED COURSE  Pertinent labs & imaging results that were available during my care of the patient were reviewed by me and considered in my medical decision making (see chart for details).  Review of the [place] CSRS was performed in accordance of the [facility] prior to dispensing any controlled drugs.    Patient presented to emergency department for evaluation of distal finger laceration.  Finger x-ray consistent  with distal tuft fracture.  Laceration was cleaned with normal saline and iodine.  It was repaired with stitches.  Tetanus shot was updated.  IM clindamycin was given.wound was dressed and splint was placed.  Patient will be discharged home with prescriptions for clindamycin, Percocet. Patient is to follow up with orthopedic as directed. Patient is given ED precautions to return to the ED for any worsening or new symptoms.     ____________________________________________  FINAL CLINICAL IMPRESSION(S) / ED DIAGNOSES  Final diagnoses:  Laceration of left middle finger without foreign body with damage to nail, initial encounter  Open displaced fracture of distal phalanx of left middle finger, initial encounter      NEW MEDICATIONS STARTED DURING THIS  VISIT:  ED Discharge Orders         Ordered    clindamycin (CLEOCIN) 300 MG capsule  4 times daily     05/26/18 1334    oxyCODONE-acetaminophen (PERCOCET) 5-325 MG tablet  Every 4 hours PRN     05/26/18 1334              This chart was dictated using voice recognition software/Dragon. Despite best efforts to proofread, errors can occur which can change the meaning. Any change was purely unintentional.    Enid Derry, PA-C 05/26/18 1510    Sharyn Creamer, MD 05/26/18 [phone]

## 2018-05-26 NOTE — ED Triage Notes (Addendum)
Patient has laceration left middle finger proximal to nailbed distal end got his finger caught in a doorframe this AM, pinched.  Bleeding noted, sterile 4X4 placed over wound and patient instructed to keep elevated.

## 2018-05-26 NOTE — ED Notes (Signed)

## 2018-05-27 ENCOUNTER — Ambulatory Visit
Admission: RE | Admit: 2018-05-27 | Discharge: 2018-05-27 | Disposition: A | Discharge status: Home / Self Care | Payer: BLUE CROSS/BLUE SHIELD | Source: Ambulatory Visit | Attending: Family Medicine | Admitting: Family Medicine

## 2018-05-27 ENCOUNTER — Other Ambulatory Visit: Payer: Self-pay | Admitting: Family Medicine

## 2018-05-27 ENCOUNTER — Encounter: Payer: Self-pay | Admitting: Family Medicine

## 2018-05-27 ENCOUNTER — Ambulatory Visit (INDEPENDENT_AMBULATORY_CARE_PROVIDER_SITE_OTHER): Payer: BLUE CROSS/BLUE SHIELD | Admitting: Family Medicine

## 2018-05-27 VITALS — BP 120/86 | HR 69 | Temp 97.8°F | Resp 16 | Wt 209.0 lb

## 2018-05-27 DIAGNOSIS — R109 Unspecified abdominal pain: Secondary | ICD-10-CM | POA: Diagnosis not present

## 2018-05-27 DIAGNOSIS — R1084 Generalized abdominal pain: Secondary | ICD-10-CM

## 2018-05-27 DIAGNOSIS — S61213A Laceration without foreign body of left middle finger without damage to nail, initial encounter: Secondary | ICD-10-CM

## 2018-05-27 DIAGNOSIS — S62603A Fracture of unspecified phalanx of left middle finger, initial encounter for closed fracture: Secondary | ICD-10-CM

## 2018-05-27 NOTE — Progress Notes (Signed)
       Patient: Andrew Green Male    DOB: 1966-06-16   52 y.o.   MRN: [id number] Visit Date: 05/27/2018  Today's Provider: [name], MD   Chief Complaint  Patient presents with  . Follow-up   Subjective:    HPI  Follow up ER visit  Patient slammed his left 3rd finger in a door yesterday and was seen in ER for laceration  and fracture of left 3rd distal phalynxron 05/26/2018. Treatment for this included repairing laceration with sutures and placing finger in a metal splint. Patient was discharged home with prescriptions for clindamycin and Percocet. Patient is to follow up with orthopedic. He reports good compliance with treatment. He prefers to not follow up with orthopedist.  He reports this condition is Unchanged.  ------------------------------------------------------------------------------------       No Known Allergies   Current Outpatient Medications:  .  amLODipine (NORVASC) 5 MG tablet, TAKE 1 TABLET(5 MG) BY MOUTH DAILY, Disp: 30 tablet, Rfl: 11 .  clindamycin (CLEOCIN) 300 MG capsule, Take 1 capsule (300 mg total) by mouth 4 (four) times daily for 10 days., Disp: 40 capsule, Rfl: 0 .  oxyCODONE-acetaminophen (PERCOCET) 5-325 MG tablet, Take 1 tablet by mouth every 4 (four) hours as needed for up to 3 days for severe pain., Disp: 12 tablet, Rfl: 0 .  sildenafil (VIAGRA) 100 MG tablet, Take 0.5-1 tablets (50-100 mg total) by mouth daily as needed for erectile dysfunction., Disp: 5 tablet, Rfl: 11 .  tadalafil (ADCIRCA/CIALIS) 20 MG tablet, Take 0.5-1 tablets (10-20 mg total) by mouth every other day as needed for erectile dysfunction., Disp: 8 tablet, Rfl: 3 .  Testosterone 20.25 MG/ACT (1.62%) GEL, Place 2 Pump onto the skin daily., Disp: 75 g, Rfl: 3 .  vardenafil (LEVITRA) 20 MG tablet, Take 1 tablet (20 mg total) by mouth daily as needed for erectile dysfunction., Disp: 10 tablet, Rfl: 5  Review of Systems  Constitutional: Negative for appetite change,  chills and fever.  Respiratory: Negative for chest tightness, shortness of breath and wheezing.   Cardiovascular: Negative for chest pain and palpitations.  Gastrointestinal: Negative for abdominal pain, nausea and vomiting.  Skin: Positive for wound.    Social History   Tobacco Use  . Smoking status: Never Smoker  . Smokeless tobacco: Never Used  Substance Use Topics  . Alcohol use: No    Alcohol/week: 0.0 standard drinks   Objective:   BP 120/86 (BP Location: Right Arm, Patient Position: Sitting, Cuff Size: Large)   Pulse 69   Temp 97.8 F (36.6 C) (Oral)   Resp 16   Wt 209 lb (94.8 kg)   SpO2 96% Comment: room air  BMI 29.99 kg/m  Vitals:   05/27/18 0936  BP: 120/86  Pulse: 69  Resp: 16  Temp: 97.8 F (36.6 C)  TempSrc: Oral  SpO2: 96%  Weight: 209 lb (94.8 kg)     Physical Exam  Splint in place. Bandages dry.     Assessment & Plan:     1. Closed displaced fracture of phalanx of left middle finger, unspecified phalanx, initial encounter   2. Laceration of left middle finger without foreign body, nail damage status unspecified, initial encounter  He prefers to not have to follow up with orthopedics. Will return 7-9 days for suture removal and follow up x-rays.        [name], MD  [facility]

## 2018-06-05 ENCOUNTER — Ambulatory Visit
Admission: RE | Admit: 2018-06-05 | Discharge: 2018-06-05 | Disposition: A | Discharge status: Home / Self Care | Payer: BLUE CROSS/BLUE SHIELD | Source: Ambulatory Visit | Attending: Family Medicine | Admitting: Family Medicine

## 2018-06-05 ENCOUNTER — Ambulatory Visit (INDEPENDENT_AMBULATORY_CARE_PROVIDER_SITE_OTHER): Payer: BLUE CROSS/BLUE SHIELD | Admitting: Family Medicine

## 2018-06-05 ENCOUNTER — Encounter: Payer: Self-pay | Admitting: Family Medicine

## 2018-06-05 VITALS — BP 128/84 | HR 88 | Temp 98.7°F | Resp 16 | Wt 210.0 lb

## 2018-06-05 DIAGNOSIS — S62633A Displaced fracture of distal phalanx of left middle finger, initial encounter for closed fracture: Secondary | ICD-10-CM | POA: Diagnosis not present

## 2018-06-05 DIAGNOSIS — S62603A Fracture of unspecified phalanx of left middle finger, initial encounter for closed fracture: Secondary | ICD-10-CM | POA: Insufficient documentation

## 2018-06-05 DIAGNOSIS — X58XXXA Exposure to other specified factors, initial encounter: Secondary | ICD-10-CM | POA: Insufficient documentation

## 2018-06-05 DIAGNOSIS — S61213A Laceration without foreign body of left middle finger without damage to nail, initial encounter: Secondary | ICD-10-CM

## 2018-06-05 NOTE — Progress Notes (Signed)
       Patient: Andrew Green Male    DOB: Jan 22, 1966   52 y.o.   MRN: [id number] Visit Date: 06/05/2018  Today's Provider: [name], MD   Chief Complaint  Patient presents with  . Laceration   Subjective:    HPI Laceration of left middle finger: Patient was last seen for this problem on 05/27/2018. No changes were made during that visit. Patient was instructed to return in 7-9 days for suture removal and follow up x-rays.     No Known Allergies   Current Outpatient Medications:  .  amLODipine (NORVASC) 5 MG tablet, TAKE 1 TABLET(5 MG) BY MOUTH DAILY, Disp: 30 tablet, Rfl: 11 .  clindamycin (CLEOCIN) 300 MG capsule, Take 1 capsule (300 mg total) by mouth 4 (four) times daily for 10 days., Disp: 40 capsule, Rfl: 0 .  sildenafil (VIAGRA) 100 MG tablet, Take 0.5-1 tablets (50-100 mg total) by mouth daily as needed for erectile dysfunction., Disp: 5 tablet, Rfl: 11 .  tadalafil (ADCIRCA/CIALIS) 20 MG tablet, Take 0.5-1 tablets (10-20 mg total) by mouth every other day as needed for erectile dysfunction., Disp: 8 tablet, Rfl: 3 .  Testosterone 20.25 MG/ACT (1.62%) GEL, Place 2 Pump onto the skin daily., Disp: 75 g, Rfl: 3 .  vardenafil (LEVITRA) 20 MG tablet, Take 1 tablet (20 mg total) by mouth daily as needed for erectile dysfunction., Disp: 10 tablet, Rfl: 5  Review of Systems  Constitutional: Negative for appetite change, chills and fever.  Respiratory: Negative for chest tightness, shortness of breath and wheezing.   Cardiovascular: Negative for chest pain and palpitations.  Gastrointestinal: Negative for abdominal pain, nausea and vomiting.  Musculoskeletal: Positive for joint swelling (in middle finger of left hand).  Skin: Positive for wound.    Social History   Tobacco Use  . Smoking status: Never Smoker  . Smokeless tobacco: Never Used  Substance Use Topics  . Alcohol use: No    Alcohol/week: 0.0 standard drinks   Objective:   BP 128/84 (BP Location: Left  Arm, Patient Position: Sitting, Cuff Size: Large)   Pulse 88   Temp 98.7 F (37.1 C) (Oral)   Resp 16   Wt 210 lb (95.3 kg)   SpO2 97% Comment: room air  BMI 30.13 kg/m  Vitals:   06/05/18 1539  BP: 128/84  Pulse: 88  Resp: 16  Temp: 98.7 F (37.1 C)  TempSrc: Oral  SpO2: 97%  Weight: 210 lb (95.3 kg)     Physical Exam  Laceration well approximated. No drainage. No erythema.     Assessment & Plan:     1. Closed displaced fracture of phalanx of left middle finger, unspecified phalanx, initial encounter Consider orthopedic referral if any question of normal healing.  - DG Finger Middle Left; Future  2. Laceration of left middle finger without foreign body, nail damage status unspecified, initial encounter Removed all 4 sutures without complication.        [name], MD  [facility]

## 2018-06-06 ENCOUNTER — Telehealth: Payer: Self-pay

## 2018-06-06 ENCOUNTER — Other Ambulatory Visit: Payer: Self-pay | Admitting: Family Medicine

## 2018-06-06 DIAGNOSIS — S62603A Fracture of unspecified phalanx of left middle finger, initial encounter for closed fracture: Secondary | ICD-10-CM

## 2018-06-06 NOTE — Telephone Encounter (Signed)
Patient advised and agrees to see orthopedics. Please schedule.

## 2018-06-06 NOTE — Telephone Encounter (Signed)
-----   Message from Malva Limes, MD sent at 06/06/2018  3:17 PM EDT ----- Patient needs to follow up with orthopedics for fracture of finger. Not sure that it is healing appropriately. Have send referral order to [facility].

## 2018-06-06 NOTE — Progress Notes (Unsigned)
refe

## 2018-06-07 DIAGNOSIS — S62633B Displaced fracture of distal phalanx of left middle finger, initial encounter for open fracture: Secondary | ICD-10-CM | POA: Diagnosis not present

## 2018-06-16 ENCOUNTER — Other Ambulatory Visit: Payer: Self-pay | Admitting: Family Medicine

## 2018-06-16 DIAGNOSIS — I10 Essential (primary) hypertension: Secondary | ICD-10-CM

## 2018-07-03 ENCOUNTER — Telehealth: Payer: Self-pay | Admitting: Family Medicine

## 2018-07-03 MED ORDER — VARDENAFIL HCL 20 MG PO TABS: 20.0000 mg | ORAL_TABLET | Freq: Every day | ORAL | 5 refills | Status: DC | PRN

## 2018-07-03 NOTE — Telephone Encounter (Signed)
[facility] has sent another drug change request stating that vardenafil (LEVITRA) 20 MG tablet is not covered by patient plan.  The preferred alternative is Tadalafiltabmg, Sildenafiltabmg.

## 2018-07-03 NOTE — Telephone Encounter (Signed)
[facility] sent a fax stating that sildenafil (VIAGRA) 100 MG tablet is not covered by patient plan.  The preferred alternative is Tadalafiltabmg, Vardenafiltabmg.  They are requesting a drug change.  Please advise

## 2018-07-04 DIAGNOSIS — S62633B Displaced fracture of distal phalanx of left middle finger, initial encounter for open fracture: Secondary | ICD-10-CM | POA: Diagnosis not present

## 2018-07-04 MED ORDER — TADALAFIL 20 MG PO TABS: 10.0000 mg | ORAL_TABLET | ORAL | 11 refills | Status: DC | PRN

## 2018-07-04 NOTE — Addendum Note (Signed)
Addended by: Mila MerryFISHER,  E on: 07/04/2018 12:04 PM   Modules accepted: Orders

## 2018-09-02 ENCOUNTER — Other Ambulatory Visit: Payer: Self-pay | Admitting: Family Medicine

## 2018-09-02 MED ORDER — TADALAFIL 20 MG PO TABS: 10.0000 mg | ORAL_TABLET | ORAL | 11 refills | Status: DC | PRN

## 2018-09-02 NOTE — Telephone Encounter (Signed)
Pt requesting refill of tadalafil (ADCIRCA/CIALIS) 20 MG tablet [facility] [place]. States a prior Berkley Harveyauth will probably need to be done.

## 2018-09-12 NOTE — Telephone Encounter (Signed)
Pt states the pharmacy said they would cover a smaller dosage (5MG ).  Pt requesting RX of 5MG  be sent into [facility] in [place]

## 2018-09-13 NOTE — Telephone Encounter (Addendum)
Can send prescription for 5mg  tablet to [facility] if he wants However, Cialis is generic now and he may find that it is cheaper at another pharmacy if he pays out of pocket.  If he uses the [facility] app on his phone he can get a months supply for under $20

## 2018-09-13 NOTE — Addendum Note (Signed)
Addended by: [name] E on: 09/13/2018 12:09 PM   Modules accepted: Orders

## 2018-09-17 ENCOUNTER — Telehealth: Payer: Self-pay | Admitting: Family Medicine

## 2018-09-17 DIAGNOSIS — N529 Male erectile dysfunction, unspecified: Secondary | ICD-10-CM

## 2018-09-17 NOTE — Telephone Encounter (Signed)
Patient has signed up with [facility] and wants the prescription for Cialis sent into [facility].

## 2018-09-17 NOTE — Telephone Encounter (Signed)
Tried calling patient to advise him of a message regarding his prescription refill request for Cialis. Please refer to that message and advise patient when he returns the call.

## 2018-09-17 NOTE — Telephone Encounter (Signed)
Pt returned missed call. °Please call pt back if needed. ° °Thanks, °TGH °

## 2018-09-17 NOTE — Addendum Note (Signed)
Addended by: Kavin Leech E on: 09/17/2018 03:31 PM   Modules accepted: Orders

## 2018-09-17 NOTE — Telephone Encounter (Signed)
See previous message.   Thanks,   -Vernona Rieger

## 2018-09-17 NOTE — Telephone Encounter (Signed)
Pt advised.  He is going to call around and let us know where to send the RX.   Thanks,   -Vernona Rieger

## 2018-09-17 NOTE — Telephone Encounter (Signed)
Pt would like RX to go to [facility].   Thanks,   -Vernona Rieger

## 2018-09-17 NOTE — Telephone Encounter (Signed)
Tried calling patient. No voice message sytem to leaved a message for patient to call back. Will try calling back at a later time.

## 2018-09-18 MED ORDER — TADALAFIL 20 MG PO TABS: 10.0000 mg | ORAL_TABLET | ORAL | 11 refills | Status: DC | PRN

## 2018-09-18 NOTE — Addendum Note (Signed)
Addended by: Cheron Every C on: 09/18/2018 03:29 PM   Modules accepted: Orders

## 2018-09-18 NOTE — Telephone Encounter (Signed)
Pt stop by to see if RX had been sent to [facility] yet.

## 2018-09-18 NOTE — Telephone Encounter (Signed)
Medication send in to [facility] pharmacy and patient was advised.

## 2019-02-07 ENCOUNTER — Encounter: Payer: Self-pay | Admitting: Family Medicine

## 2019-02-07 ENCOUNTER — Other Ambulatory Visit: Payer: Self-pay

## 2019-02-07 ENCOUNTER — Ambulatory Visit (INDEPENDENT_AMBULATORY_CARE_PROVIDER_SITE_OTHER): Payer: BLUE CROSS/BLUE SHIELD | Admitting: Family Medicine

## 2019-02-07 VITALS — BP 100/60 | HR 89 | Temp 98.7°F | Resp 16 | Wt 189.2 lb

## 2019-02-07 DIAGNOSIS — M545 Low back pain, unspecified: Secondary | ICD-10-CM

## 2019-02-07 MED ORDER — CYCLOBENZAPRINE HCL 10 MG PO TABS: 10.0000 mg | ORAL_TABLET | Freq: Three times a day (TID) | ORAL | 0 refills | Status: DC | PRN

## 2019-02-07 NOTE — Progress Notes (Signed)
Patient: Andrew Green Male    DOB: 22-Dec-1965   53 y.o.   MRN: [id number] Visit Date: 02/07/2019  Today's Provider: Vernie Murders, PA   Chief Complaint  Patient presents with  . Back Pain   Subjective:     Back Pain This is a new problem. The current episode started in the past 7 days (patient reports pain started after lifting heavy box at work). The problem is unchanged. The pain is present in the lumbar spine. The quality of the pain is described as shooting. The pain does not radiate. The symptoms are aggravated by bending, position, sitting and twisting. Associated symptoms include headaches. Pertinent negatives include no abdominal pain, bladder incontinence, bowel incontinence, chest pain, dysuria, fever, leg pain, numbness, paresis, paresthesias, perianal numbness, tingling, weakness or weight loss. He has tried NSAIDs and heat for the symptoms. The treatment provided mild relief.   Past Medical History:  Diagnosis Date  . History of chicken pox   . History of measles   . History of mumps    Past Surgical History:  Procedure Laterality Date  . HERNIA REPAIR  1970   Family History  Problem Relation Age of Onset  . Cervical cancer Mother   . Colon polyps Father   . Lung cancer Maternal Grandmother   . Brain cancer Maternal Grandmother   . Congestive Heart Failure Paternal Grandmother   . CAD Neg Hx    Allergies  Allergen Reactions  . Other     Current Outpatient Medications:  .  amLODipine (NORVASC) 5 MG tablet, TAKE 1 TABLET(5 MG) BY MOUTH DAILY, Disp: 30 tablet, Rfl: 12 .  tadalafil (ADCIRCA/CIALIS) 20 MG tablet, Take 0.5-1 tablets (10-20 mg total) by mouth every other day as needed for erectile dysfunction., Disp: 10 tablet, Rfl: 11 .  Testosterone 20.25 MG/ACT (1.62%) GEL, Place 2 Pump onto the skin daily., Disp: 75 g, Rfl: 3  Review of Systems  Constitutional: Negative for fever and weight loss.  Cardiovascular: Negative for chest pain.   Gastrointestinal: Negative for abdominal pain and bowel incontinence.  Genitourinary: Negative for bladder incontinence and dysuria.  Musculoskeletal: Positive for back pain.  Neurological: Positive for headaches. Negative for tingling, weakness, numbness and paresthesias.   Social History   Tobacco Use  . Smoking status: Never Smoker  . Smokeless tobacco: Never Used  Substance Use Topics  . Alcohol use: No    Alcohol/week: 0.0 standard drinks     Objective:   BP 100/60   Pulse 89   Temp 98.7 F (37.1 C) (Oral)   Resp 16   Wt 189 lb 3.2 oz (85.8 kg)   SpO2 97%   BMI 27.15 kg/m    Vitals:   02/07/19 1600  BP: 100/60  Pulse: 89  Resp: 16  Temp: 98.7 F (37.1 C)  TempSrc: Oral  SpO2: 97%  Weight: 189 lb 3.2 oz (85.8 kg)   Physical Exam Constitutional:      General: He is not in acute distress.    Appearance: He is well-developed.  HENT:     Head: Normocephalic and atraumatic.     Right Ear: Hearing normal.     Left Ear: Hearing normal.     Nose: Nose normal.  Eyes:     General: Lids are normal. No scleral icterus.       Right eye: No discharge.        Left eye: No discharge.     Conjunctiva/sclera: Conjunctivae normal.  Cardiovascular:     Rate and Rhythm: Normal rate.     Heart sounds: Normal heart sounds.  Pulmonary:     Effort: Pulmonary effort is normal. No respiratory distress.  Abdominal:     General: Bowel sounds are normal.     Palpations: Abdomen is soft.  Musculoskeletal: Normal range of motion.     Comments: Tight pain with spasms without radiation. SLR's 90 degrees without pain. DTR's equal and active. Soreness in lower midline back.  Skin:    Findings: No lesion or rash.  Neurological:     Mental Status: He is alert and oriented to person, place, and time.  Psychiatric:        Speech: Speech normal.        Behavior: Behavior normal.        Thought Content: Thought content normal.       Assessment & Plan    1. Acute midline low back  pain without sciatica Onset of low back pain over the past 7 days that started as he was lifting heavy boxes at work. Has been at this job since the Fall of 2019. Working more physically has helped him lose approximately 22-25 lbs. Having sharp pains in the low back with transitioning from sitting to standing and the reverse. No radiation to legs and no numbness. Will treat with Ibuprofen 200 mg 4 tablets TID with food, Flexeril 10 mg TID prn spasm, moist heat or ice pack applications and rehab back exercises. Out of work 02-07-19 through 02-11-19. May return to work on 02-12-19 and use a lumbar supporter for lifting duties. (Medications can cause drowsiness.) Recheck if no better in 5 days. - cyclobenzaprine (FLEXERIL) 10 MG tablet; Take 1 tablet (10 mg total) by mouth 3 (three) times daily as needed for muscle spasms.  Dispense: 30 tablet; Refill: 0     Dortha Kernennis Chrismon, PA  [facility] Leo RodI,Kathleen J ElmaWolford,acting as a Neurosurgeonscribe for [employer] Southern[employer]ennis Chrismon, PA.,have documented all relevant documentation on the behalf of Dortha KernDennis Chrismon, PA,as directed by  [employer] SouthernDennis Chrismon, PA while in the presence of [employer] SouthernDennis Chrismon, [place].

## 2019-02-07 NOTE — Patient Instructions (Addendum)
Low Back Strain  A strain is a stretch or tear in a muscle or the strong cords of tissue that attach muscle to bone (tendons). Strains of the lower back (lumbar spine) are a common cause of low back pain. A strain occurs when muscles or tendons are torn or are stretched beyond their limits. The muscles may become inflamed, resulting in pain and sudden muscle tightening (spasms). A strain can happen suddenly due to an injury (trauma), or it can develop gradually due to overuse. There are three types of strains:  Grade 1 is a mild strain involving a minor tear of the muscle fibers or tendons. This may cause some pain but no loss of muscle strength.  Grade 2 is a moderate strain involving a partial tear of the muscle fibers or tendons. This causes more severe pain and some loss of muscle strength.  Grade 3 is a severe strain involving a complete tear of the muscle or tendon. This causes severe pain and complete or nearly complete loss of muscle strength. What are the causes? This condition may be caused by:  Trauma, such as a fall or a hit to the body.  Twisting or overstretching the back. This may result from doing activities that require a lot of energy, such as lifting heavy objects. What increases the risk? The following factors may increase your risk of getting this condition:  Playing contact sports.  Participating in sports or activities that put excessive stress on the back and require a lot of bending and twisting, including: ? Lifting weights or heavy objects. ? Gymnastics. ? Soccer. ? Figure skating. ? Snowboarding.  Being overweight or obese.  Having poor strength and flexibility. What are the signs or symptoms? Symptoms of this condition may include:  Sharp or dull pain in the lower back that does not go away. Pain may extend to the buttocks.  Stiffness.  Limited range of motion.  Inability to stand up straight due to stiffness or pain.  Muscle spasms. How is this  diagnosed? This condition may be diagnosed based on:  Your symptoms.  Your medical history.  A physical exam. ? Your health care provider may push on certain areas of your back to determine the source of your pain. ? You may be asked to bend forward, backward, and side to side to assess the severity of your pain and your range of motion.  Imaging tests, such as: ? X-rays. ? MRI. How is this treated? Treatment for this condition may include:  Applying heat and cold to the affected area.  Medicines to help relieve pain and to relax your muscles (muscle relaxants).  NSAIDs to help reduce swelling and discomfort.  Physical therapy. When your symptoms improve, it is important to gradually return to your normal routine as soon as possible to reduce pain, avoid stiffness, and avoid loss of muscle strength. Generally, symptoms should improve within 6 weeks of treatment. However, recovery time varies. Follow these instructions at home: Managing pain, stiffness, and swelling  If directed, apply ice to the injured area during the first 24 hours after your injury. ? Put ice in a plastic bag. ? Place a towel between your skin and the bag. ? Leave the ice on for 20 minutes, 2-3 times a day.  If directed, apply heat to the affected area as often as told by your health care provider. Use the heat source that your health care provider recommends, such as a moist heat pack or a heating pad. ?   Place a towel between your skin and the heat source. ? Leave the heat on for 20-30 minutes. ? Remove the heat if your skin turns bright red. This is especially important if you are unable to feel pain, heat, or cold. You may have a greater risk of getting burned. Activity  Rest and return to your normal activities as told by your health care provider. Ask your health care provider what activities are safe for you.  Avoid activities that take a lot of effort (are strenuous) for as long as told by your  health care provider.  Do exercises as told by your health care provider. General instructions   Take over-the-counter and prescription medicines only as told by your health care provider.  If you have questions or concerns about safety while taking pain medicine, talk with your health care provider.  Do not drive or operate heavy machinery until you know how your pain medicine affects you.  Do not use any tobacco products, such as cigarettes, chewing tobacco, and e-cigarettes. Tobacco can delay bone healing. If you need help quitting, ask your health care provider.  Keep all follow-up visits as told by your health care provider. This is important. How is this prevented?               Warm up and stretch before being active.  Cool down and stretch after being active.  Give your body time to rest between periods of activity.  Avoid: ? Being physically inactive for long periods at a time. ? Exercising or playing sports when you are tired or in pain.  Use correct form when playing sports and lifting heavy objects.  Use good posture when sitting and standing.  Maintain a healthy weight.  Sleep on a mattress with medium firmness to support your back.  Make sure to use equipment that fits you, including shoes that fit well.  Be safe and responsible while being active to avoid falls.  Do at least 150 minutes of moderate-intensity exercise each week, such as brisk walking or water aerobics. Try a form of exercise that takes stress off your back, such as swimming or stationary cycling.  Maintain physical fitness, including: ? Strength. ? Flexibility. ? Cardiovascular fitness. ? Endurance. Contact a health care provider if:  Your back pain does not improve after 6 weeks of treatment.  Your symptoms get worse. Get help right away if:  Your back pain is severe.  You are unable to stand or walk.  You develop pain in your legs.  You develop weakness in your  buttocks or legs.  You have difficulty controlling when you urinate or when you have a bowel movement. This information is not intended to replace advice given to you by your health care provider. Make sure you discuss any questions you have with your health care provider. Document Released: 08/07/2005 Document Revised: 10/02/2016 Document Reviewed: 05/19/2015 Elsevier Interactive Patient Education  2019 [employer] Back Exercises The following exercises strengthen the muscles that help to support the back. They also help to keep the lower back flexible. Doing these exercises can help to prevent back pain or lessen existing pain. If you have back pain or discomfort, try doing these exercises 2-3 times each day or as told by your health care provider. When the pain goes away, do them once each day, but increase the number of times that you repeat the steps for each exercise (do more repetitions). If you do not have back pain or discomfort,   do these exercises once each day or as told by your health care provider. Exercises Single Knee to Chest Repeat these steps 3-5 times for each leg: 1. Lie on your back on a firm bed or the floor with your legs extended. 2. Bring one knee to your chest. Your other leg should stay extended and in contact with the floor. 3. Hold your knee in place by grabbing your knee or thigh. 4. Pull on your knee until you feel a gentle stretch in your lower back. 5. Hold the stretch for 10-30 seconds. 6. Slowly release and straighten your leg. Pelvic Tilt Repeat these steps 5-10 times: 1. Lie on your back on a firm bed or the floor with your legs extended. 2. Bend your knees so they are pointing toward the ceiling and your feet are flat on the floor. 3. Tighten your lower abdominal muscles to press your lower back against the floor. This motion will tilt your pelvis so your tailbone points up toward the ceiling instead of pointing to your feet or the floor. 4. With gentle  tension and even breathing, hold this position for 5-10 seconds. Cat-Cow Repeat these steps until your lower back becomes more flexible: 1. Get into a hands-and-knees position on a firm surface. Keep your hands under your shoulders, and keep your knees under your hips. You may place padding under your knees for comfort. 2. Let your head hang down, and point your tailbone toward the floor so your lower back becomes rounded like the back of a cat. 3. Hold this position for 5 seconds. 4. Slowly lift your head and point your tailbone up toward the ceiling so your back forms a sagging arch like the back of a cow. 5. Hold this position for 5 seconds.  Press-Ups Repeat these steps 5-10 times: 1. Lie on your abdomen (face-down) on the floor. 2. Place your palms near your head, about shoulder-width apart. 3. While you keep your back as relaxed as possible and keep your hips on the floor, slowly straighten your arms to raise the top half of your body and lift your shoulders. Do not use your back muscles to raise your upper torso. You may adjust the placement of your hands to make yourself more comfortable. 4. Hold this position for 5 seconds while you keep your back relaxed. 5. Slowly return to lying flat on the floor.  Bridges Repeat these steps 10 times: 1. Lie on your back on a firm surface. 2. Bend your knees so they are pointing toward the ceiling and your feet are flat on the floor. 3. Tighten your buttocks muscles and lift your buttocks off of the floor until your waist is at almost the same height as your knees. You should feel the muscles working in your buttocks and the back of your thighs. If you do not feel these muscles, slide your feet 1-2 inches farther away from your buttocks. 4. Hold this position for 3-5 seconds. 5. Slowly lower your hips to the starting position, and allow your buttocks muscles to relax completely. If this exercise is too easy, try doing it with your arms crossed  over your chest. Abdominal Crunches Repeat these steps 5-10 times: 1. Lie on your back on a firm bed or the floor with your legs extended. 2. Bend your knees so they are pointing toward the ceiling and your feet are flat on the floor. 3. Cross your arms over your chest. 4. Tip your chin slightly toward your chest without   bending your neck. 5. Tighten your abdominal muscles and slowly raise your trunk (torso) high enough to lift your shoulder blades a tiny bit off of the floor. Avoid raising your torso higher than that, because it can put too much stress on your low back and it does not help to strengthen your abdominal muscles. 6. Slowly return to your starting position. Back Lifts Repeat these steps 5-10 times: 1. Lie on your abdomen (face-down) with your arms at your sides, and rest your forehead on the floor. 2. Tighten the muscles in your legs and your buttocks. 3. Slowly lift your chest off of the floor while you keep your hips pressed to the floor. Keep the back of your head in line with the curve in your back. Your eyes should be looking at the floor. 4. Hold this position for 3-5 seconds. 5. Slowly return to your starting position. Contact a health care provider if:  Your back pain or discomfort gets much worse when you do an exercise.  Your back pain or discomfort does not lessen within 2 hours after you exercise. If you have any of these problems, stop doing these exercises right away. Do not do them again unless your health care provider says that you can. Get help right away if:  You develop sudden, severe back pain. If this happens, stop doing the exercises right away. Do not do them again unless your health care provider says that you can. This information is not intended to replace advice given to you by your health care provider. Make sure you discuss any questions you have with your health care provider. Document Released: 09/14/2004 Document Revised: 12/11/2017 Document  Reviewed: 10/01/2014 Elsevier Interactive Patient Education  2019 Elsevier Inc.  

## 2019-06-25 NOTE — Progress Notes (Signed)
Patient: Andrew Green Male    DOB: 02-Apr-1966   53 y.o.   MRN: [id number] Visit Date: 06/26/2019  Today's Provider: Trinna Post, PA-C   Chief Complaint  Patient presents with  . Diarrhea   Subjective:    I, Porsha McClurkin CMA, am acting as a Education administrator for [employer].   Virtual Visit via Video Note  I connected with Christella Noa on 06/26/19 at  8:00 AM EST by a video enabled telemedicine application and verified that I am speaking with the correct person using two identifiers.  Location: Patient: Home Provider: Office   Interactive audio and video communications were attempted, although failed due to patient's inability to connect to video. Continued visit with audio only interaction with patient agreement.  I discussed the limitations of evaluation and management by telemedicine and the availability of in person appointments. The patient expressed understanding and agreed to proceed.h  Diarrhea  This is a new problem. The current episode started in the past 7 days. The problem occurs 2 to 4 times per day. The stool consistency is described as watery. Associated symptoms include bloating and vomiting. Pertinent negatives include no coughing, fever or URI. There are no known risk factors. He has tried increased fluids for the symptoms.   Reports sick contact with granddaughter who is 14 months hand had vomiting and fever over the weekend. Patient reports he woke up on Sunday with abdominal bloating and gas. Reports he had several episodes of diarrhea and vomiting. Denies fever throughout this episode. No bloody diarrhea, no antibiotics    Allergies  Allergen Reactions  . Other      Current Outpatient Medications:  .  amLODipine (NORVASC) 5 MG tablet, TAKE 1 TABLET(5 MG) BY MOUTH DAILY, Disp: 30 tablet, Rfl: 12 .  cyclobenzaprine (FLEXERIL) 10 MG tablet, Take 1 tablet (10 mg total) by mouth 3 (three) times daily as needed for muscle spasms., Disp: 30  tablet, Rfl: 0 .  tadalafil (ADCIRCA/CIALIS) 20 MG tablet, Take 0.5-1 tablets (10-20 mg total) by mouth every other day as needed for erectile dysfunction., Disp: 10 tablet, Rfl: 11 .  Testosterone 20.25 MG/ACT (1.62%) GEL, Place 2 Pump onto the skin daily., Disp: 75 g, Rfl: 3  Review of Systems  Constitutional: Negative.  Negative for fever.  HENT: Negative.   Respiratory: Negative.  Negative for cough.   Gastrointestinal: Positive for bloating, diarrhea and vomiting.  Neurological: Negative.     Social History   Tobacco Use  . Smoking status: Never Smoker  . Smokeless tobacco: Never Used  Substance Use Topics  . Alcohol use: No    Alcohol/week: 0.0 standard drinks      Objective:   There were no vitals taken for this visit. There were no vitals filed for this visit.There is no height or weight on file to calculate BMI.   Physical Exam   No results found for any visits on 06/26/19.     Assessment & Plan    1. Diarrhea, unspecified type  He is feeling largely better today. Counseled on course of viral gastroenteritis which seems likely especially in context of sick household contact. Patient declines nausea medication, encouraged increased fluid intake. Suggested covid test at drive up [facility] site.   - Novel Coronavirus, NAA ([facility])  2. Vomiting, intractability of vomiting not specified, presence of nausea not specified, unspecified vomiting type  - Novel Coronavirus, NAA ([facility])   I discussed the assessment and treatment plan with  the patient. The patient was provided an opportunity to ask questions and all were answered. The patient agreed with the plan and demonstrated an understanding of the instructions.   The patient was advised to call back or seek an in-person evaluation if the symptoms worsen or if the condition fails to improve as anticipated.  I provided 15 minutes of non-face-to-face time during this encounter.  The entirety of the information  documented in the History of Present Illness, Review of Systems and Physical Exam were personally obtained by me. Portions of this information were initially documented by [facility], CMA and reviewed by me for thoroughness and accuracy.         Trey Sailors, PA-C  [facility]

## 2019-06-26 ENCOUNTER — Ambulatory Visit (INDEPENDENT_AMBULATORY_CARE_PROVIDER_SITE_OTHER): Payer: BLUE CROSS/BLUE SHIELD | Admitting: Physician Assistant

## 2019-06-26 DIAGNOSIS — R1110 Vomiting, unspecified: Secondary | ICD-10-CM | POA: Diagnosis not present

## 2019-06-26 DIAGNOSIS — R197 Diarrhea, unspecified: Secondary | ICD-10-CM | POA: Diagnosis not present

## 2019-06-26 NOTE — Patient Instructions (Signed)

## 2019-07-10 ENCOUNTER — Encounter: Payer: Self-pay | Admitting: Family Medicine

## 2019-07-10 ENCOUNTER — Ambulatory Visit: Payer: Self-pay

## 2019-07-10 ENCOUNTER — Ambulatory Visit (INDEPENDENT_AMBULATORY_CARE_PROVIDER_SITE_OTHER): Payer: BLUE CROSS/BLUE SHIELD | Admitting: Family Medicine

## 2019-07-10 VITALS — Temp 101.5°F

## 2019-07-10 DIAGNOSIS — R509 Fever, unspecified: Secondary | ICD-10-CM | POA: Diagnosis not present

## 2019-07-10 DIAGNOSIS — Z20828 Contact with and (suspected) exposure to other viral communicable diseases: Secondary | ICD-10-CM

## 2019-07-10 NOTE — Telephone Encounter (Signed)
Returned call to patient who states that last night he started to have body aches and today has fever of 101.9.  He states he has not taken any medication to relieve fever or pain. He states he feels like he has flu.  He has not received his flu vaccine this year. Care advice for possible COVID-19 read to patient.  Test site information reviewed with patient and he was encouraged to get tested and quarantine . Call transferred to office for scheduling.   Reason for Disposition . [1] COVID-19 infection suspected by caller or triager AND [2] mild symptoms (cough, fever, or others) AND [2] no complications or SOB  Answer Assessment - Initial Assessment Questions 1. COVID-19 DIAGNOSIS: "Who made your Coronavirus (COVID-19) diagnosis?" "Was it confirmed by a positive lab test?" If not diagnosed by a HCP, ask "Are there lots of cases (community spread) where you live?" (See public health department website, if unsure)     No community 2. ONSET: "When did the COVID-19 symptoms start?"      Last night body aches and fever 3. WORST SYMPTOM: "What is your worst symptom?" (e.g., cough, fever, shortness of breath, muscle aches)     Fever 101.9, body aches 4. COUGH: "Do you have a cough?" If so, ask: "How bad is the cough?"     no 5. FEVER: "Do you have a fever?" If so, ask: "What is your temperature, how was it measured, and when did it start?"     101.9 6. RESPIRATORY STATUS: "Describe your breathing?" (e.g., shortness of breath, wheezing, unable to speak)      no 7. BETTER-SAME-WORSE: "Are you getting better, staying the same or getting worse compared to yesterday?"  If getting worse, ask, "In what way?"     worse 8. HIGH RISK DISEASE: "Do you have any chronic medical problems?" (e.g., asthma, heart or lung disease, weak immune system, etc.)     no 9. PREGNANCY: "Is there any chance you are pregnant?" "When was your last menstrual period?"    N/A 10. OTHER SYMPTOMS: "Do you have any other symptoms?"   (e.g., chills, fatigue, headache, loss of smell or taste, muscle pain, sore throat)      Muscle pain, tired  Protocols used: CORONAVIRUS (COVID-19) DIAGNOSED OR SUSPECTED-A-AH

## 2019-07-10 NOTE — Progress Notes (Signed)
Andrew Green  MRN: [id number] DOB: 08/06/1966 Virtual Visit via Video Note  I connected with Andrew Green on 07/10/19 at  2:20 PM EST by a video enabled telemedicine application and verified that I am speaking with the correct person using two identifiers.  Location: Patient: Home Provider: Office   I discussed the limitations of evaluation and management by telemedicine and the availability of in person appointments. The patient expressed understanding and agreed to proceed.  Subjective:  HPI   Patient is a 53 year old male who presents via phone visit for possible covid vs. Flu symptoms.  Patient states he started with symptoms last night and woke up this morning feeling worse.  He has had fever of 101.9, body aches and fatigue. He has not had any cough or shortness of breath.  When he called in he was instructed by the [facility] about the Covid test site and is in the process of being tested.    Patient Active Problem List   Diagnosis Date Noted  . History of adenomatous polyp of colon 12/27/2016  . Neuralgia 07/27/2015  . Allergic rhinitis 07/13/2015  . Essential hypertension 07/13/2015  . GERD (gastroesophageal reflux disease) 07/13/2015  . Testicular hypofunction 02/07/2010  . ED (erectile dysfunction) of organic origin 12/20/2009    Past Medical History:  Diagnosis Date  . History of chicken pox   . History of measles   . History of mumps     Social History   Socioeconomic History  . Marital status: Married    Spouse name: Not on file  . Number of children: 2  . Years of education: Not on file  . Highest education level: Not on file  Occupational History  . Occupation: Furniture conservator/restorer  Social Needs  . Financial resource strain: Not on file  . Food insecurity    Worry: Not on file    Inability: Not on file  . Transportation needs    Medical: Not on file    Non-medical: Not on file  Tobacco Use  . Smoking status: Never Smoker  . Smokeless tobacco: Never  Used  Substance and Sexual Activity  . Alcohol use: No    Alcohol/week: 0.0 standard drinks  . Drug use: No  . Sexual activity: Not on file  Lifestyle  . Physical activity    Days per week: Not on file    Minutes per session: Not on file  . Stress: Not on file  Relationships  . Social Herbalist on phone: Not on file    Gets together: Not on file    Attends religious service: Not on file    Active member of club or organization: Not on file    Attends meetings of clubs or organizations: Not on file    Relationship status: Not on file  . Intimate partner violence    Fear of current or ex partner: Not on file    Emotionally abused: Not on file    Physically abused: Not on file    Forced sexual activity: Not on file  Other Topics Concern  . Not on file  Social History Narrative  . Not on file    Outpatient Encounter Medications as of 07/10/2019  Medication Sig  . amLODipine (NORVASC) 5 MG tablet TAKE 1 TABLET(5 MG) BY MOUTH DAILY  . cyclobenzaprine (FLEXERIL) 10 MG tablet Take 1 tablet (10 mg total) by mouth 3 (three) times daily as needed for muscle spasms.  . tadalafil (ADCIRCA/CIALIS)  20 MG tablet Take 0.5-1 tablets (10-20 mg total) by mouth every other day as needed for erectile dysfunction.  . Testosterone 20.25 MG/ACT (1.62%) GEL Place 2 Pump onto the skin daily.   No facility-administered encounter medications on file as of 07/10/2019.     Allergies  Allergen Reactions  . Other     Review of Systems  Constitutional: Positive for chills, fever and malaise/fatigue. Negative for diaphoresis.  Respiratory: Negative for cough and shortness of breath.   Musculoskeletal: Positive for myalgias.    Objective:  Temp (!) 101.5 F (38.6 C)   Physical Exam:No apparent acute respiratory symptoms during telephonic interview.  Assessment and Plan :  1. Suspected COVID-19 virus infection Having body aches with fever spike to 101.5 this morning. No cough, sore  throat, loss of taste/smell or headache. No shortness of breath. Recommend COVID testing and start home monitoring. Was not able to get test today because the drive-up testing site was "overwhelmed". Will get it in the morning. Advised to go to the ER if fever rises or develops dyspnea. Increase fluid intake and may use Tylenol prn. Must maintain isolation at home and protect family from exposure. Will start home monitoring. - Novel Coronavirus, NAA ([facility])  2. Fever, unspecified fever cause Fever spike to 101.5 today. Took Tylenol at 12:30 pm with improvement in "flu-like" symptoms. - Novel Coronavirus, NAA ([facility])  Follow Up Instructions:  I discussed the assessment and treatment plan with the patient. The patient was provided an opportunity to ask questions and all were answered. The patient agreed with the plan and demonstrated an understanding of the instructions.   The patient was advised to call back or seek an in-person evaluation if the symptoms worsen or if the condition fails to improve as anticipated.  I provided 15 minutes of non-face-to-face time during this encounter.  Dortha Kern, PA

## 2019-07-11 ENCOUNTER — Other Ambulatory Visit: Payer: Self-pay

## 2019-07-11 DIAGNOSIS — Z20822 Contact with and (suspected) exposure to covid-19: Secondary | ICD-10-CM

## 2019-07-13 ENCOUNTER — Encounter (INDEPENDENT_AMBULATORY_CARE_PROVIDER_SITE_OTHER): Payer: Self-pay

## 2019-07-14 ENCOUNTER — Ambulatory Visit: Payer: Self-pay

## 2019-07-14 ENCOUNTER — Encounter (INDEPENDENT_AMBULATORY_CARE_PROVIDER_SITE_OTHER): Payer: Self-pay

## 2019-07-14 ENCOUNTER — Other Ambulatory Visit: Payer: Self-pay | Admitting: Family Medicine

## 2019-07-14 DIAGNOSIS — I10 Essential (primary) hypertension: Secondary | ICD-10-CM

## 2019-07-14 LAB — NOVEL CORONAVIRUS, NAA: SARS-CoV-2, NAA: DETECTED — AB

## 2019-07-14 NOTE — Telephone Encounter (Signed)
Covid-19 results see lab note.

## 2019-07-15 ENCOUNTER — Encounter (INDEPENDENT_AMBULATORY_CARE_PROVIDER_SITE_OTHER): Payer: Self-pay

## 2019-07-16 ENCOUNTER — Encounter (INDEPENDENT_AMBULATORY_CARE_PROVIDER_SITE_OTHER): Payer: Self-pay

## 2019-07-17 ENCOUNTER — Encounter (INDEPENDENT_AMBULATORY_CARE_PROVIDER_SITE_OTHER): Payer: Self-pay

## 2019-07-18 ENCOUNTER — Telehealth: Payer: Self-pay

## 2019-07-18 ENCOUNTER — Encounter (INDEPENDENT_AMBULATORY_CARE_PROVIDER_SITE_OTHER): Payer: Self-pay

## 2019-07-18 NOTE — Telephone Encounter (Signed)
Patient advised on cough per protocol:   If cough remains the same or better: continue to treat with over the counter medications. Hard candy or cough drops and drinking warm fluids. Adults can also use honey 2 tsp (10 ML) at bedtime.   HONEY IS NOT RECOMMENDED FOR INFANTS UNDER ONE.    If cough is becoming worse even with the use of over the counter medications and patient is not able to sleep at night, cough becomes productive with sputum that maybe yellow or green in color, contact PCP.  Patient states that he has a dry cough. Patient will treat with otc medications. Patient verbalized understanding

## 2019-07-19 ENCOUNTER — Encounter (INDEPENDENT_AMBULATORY_CARE_PROVIDER_SITE_OTHER): Payer: Self-pay

## 2019-07-20 ENCOUNTER — Encounter (INDEPENDENT_AMBULATORY_CARE_PROVIDER_SITE_OTHER): Payer: Self-pay

## 2019-07-21 ENCOUNTER — Encounter (INDEPENDENT_AMBULATORY_CARE_PROVIDER_SITE_OTHER): Payer: Self-pay

## 2019-07-21 NOTE — Telephone Encounter (Signed)
Patient requesting Guaifenesin for cough that has not improved, patient would like a follow up call, please advise  [facility] [place], [place]

## 2019-07-21 NOTE — Telephone Encounter (Signed)
From [facility] 

## 2019-07-21 NOTE — Telephone Encounter (Signed)
Patient advised.

## 2019-07-21 NOTE — Telephone Encounter (Signed)
Guaifenesin is over the counter.

## 2019-07-23 ENCOUNTER — Encounter (INDEPENDENT_AMBULATORY_CARE_PROVIDER_SITE_OTHER): Payer: Self-pay

## 2019-10-13 ENCOUNTER — Other Ambulatory Visit: Payer: Self-pay | Admitting: Family Medicine

## 2019-10-13 DIAGNOSIS — I10 Essential (primary) hypertension: Secondary | ICD-10-CM

## 2019-10-13 NOTE — Telephone Encounter (Signed)
Requested Prescriptions  Pending Prescriptions Disp Refills  . amLODipine (NORVASC) 5 MG tablet [Pharmacy Med Name: AMLODIPINE BESYLATE 5MG  TABLETS] 30 tablet 2    Sig: TAKE 1 TABLET(5 MG) BY MOUTH DAILY     Cardiovascular:  Calcium Channel Blockers Passed - 10/13/2019  3:19 AM      Passed - Last BP in normal range    BP Readings from Last 1 Encounters:  02/07/19 100/60         Passed - Valid encounter within last 6 months    Recent Outpatient Visits          3 months ago Suspected COVID-19 virus infection   02/09/19, [place] E, [place]   3 months ago Diarrhea, unspecified type   [place], Adriana M, PA-C   8 months ago Acute midline low back pain without sciatica   M, [place] E, [place]   1 year ago Closed displaced fracture of phalanx of left middle finger, unspecified phalanx, initial encounter   [facility], MD   1 year ago Closed displaced fracture of phalanx of left middle finger, unspecified phalanx, initial encounter   [facility] Fisher, [facility], MD

## 2020-01-08 ENCOUNTER — Telehealth: Payer: Self-pay | Admitting: Family Medicine

## 2020-01-08 DIAGNOSIS — I10 Essential (primary) hypertension: Secondary | ICD-10-CM

## 2020-01-12 NOTE — Telephone Encounter (Signed)
Patient spoke with pharmacist who denies receiving amLODipine (NORVASC) 5 MG tablet prescription. Patient states pharmacy is requesting a paper copy or fax patient would like a follow up call today.   [id number] [place], [place] - [address] AT [facility] [address] [facility] Phone:  [phone]  Fax:  [phone]

## 2020-01-12 NOTE — Telephone Encounter (Signed)
Prescription phoned into pharmacy. Patient notified.

## 2020-01-22 ENCOUNTER — Encounter: Payer: Self-pay | Admitting: Physician Assistant

## 2020-01-22 ENCOUNTER — Other Ambulatory Visit: Payer: Self-pay

## 2020-01-22 ENCOUNTER — Ambulatory Visit: Payer: BC Managed Care – PPO | Admitting: Physician Assistant

## 2020-01-22 VITALS — BP 129/87 | HR 80 | Temp 97.3°F | Resp 16 | Wt 207.0 lb

## 2020-01-22 DIAGNOSIS — M25562 Pain in left knee: Secondary | ICD-10-CM

## 2020-01-22 NOTE — Progress Notes (Signed)
Established patient visit   Patient: Andrew Green   DOB: [birth year]   54 y.o. Male  MRN: [id number] Visit Date: 01/22/2020  Today's healthcare provider: Trinna Post, PA-C   Chief Complaint  Patient presents with  . Knee Pain    x 3-4 months   Subjective    Knee Pain  Incident onset: 2-3 months ago. There was no injury mechanism. The pain is present in the left knee. The pain is moderate. The pain has been intermittent since onset. The symptoms are aggravated by movement and palpation. Treatments tried: knee brace. The treatment provided mild relief.   Reports knee pain over the past several months, previously working on concrete frequently. 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 In the past several months reports inside of his left knee has pain. If the leg is bent a certain way he will have pain. If he tried to cross his knee over his leg he will feel strain. Reports he initially had swelling which has since resolved. Never had xray of left knee. Walking with a limp.       Medications: Outpatient Medications Prior to Visit  Medication Sig  . amLODipine (NORVASC) 5 MG tablet TAKE 1 TABLET(5 MG) BY MOUTH DAILY  . cyclobenzaprine (FLEXERIL) 10 MG tablet Take 1 tablet (10 mg total) by mouth 3 (three) times daily as needed for muscle spasms. (Patient not taking: Reported on 01/22/2020)  . tadalafil (ADCIRCA/CIALIS) 20 MG tablet Take 0.5-1 tablets (10-20 mg total) by mouth every other day as needed for erectile dysfunction. (Patient not taking: Reported on 01/22/2020)  . Testosterone 20.25 MG/ACT (1.62%) GEL Place 2 Pump onto the skin daily. (Patient not taking: Reported on 01/22/2020)   No facility-administered medications prior to visit.    Review of Systems  Constitutional: Negative for appetite change, chills and fever.  Respiratory: Negative for chest tightness, shortness of breath and wheezing.   Cardiovascular: Negative for chest pain and palpitations.    Gastrointestinal: Negative for abdominal pain, nausea and vomiting.  Musculoskeletal: Positive for arthralgias (left knee pain).      Objective    BP 129/87 (BP Location: Right Arm, Patient Position: Sitting, Cuff Size: Large)   Pulse 80   Temp (!) 97.3 F (36.3 C) (Temporal)   Resp 16   Wt 207 lb (93.9 kg)   BMI 29.70 kg/m    Physical Exam Constitutional:      Appearance: Normal appearance.  Musculoskeletal:     Right knee: No swelling, deformity or effusion.     Instability Tests: Negative medial McMurray test and negative lateral McMurray test.     Left knee: No swelling, deformity or effusion. No MCL laxity.Normal alignment.     Instability Tests: Positive medial McMurray test.     Comments: Some pain in left knee with valgus stress.   Skin:    General: Skin is warm and dry.  Neurological:     Mental Status: He is alert and oriented to person, place, and time. Mental status is at baseline.  Psychiatric:        Mood and Affect: Mood normal.        Behavior: Behavior normal.       No results found for any visits on 01/22/20.  Assessment & Plan    1. Acute pain of left knee  Suspect more soft tissue etiology, MCL tear or possibly medial meniscal tear. Counseled on stepwise treatments including NSAIDs oral and topically, physical therapy, and  further eval with ortho. Patient understands OTC recommendations and will think further about ortho. Can contact for referral if needed.     Return if symptoms worsen or fail to improve.      ITrey Sailors, PA-C, have reviewed all documentation for this visit. The documentation on 01/23/20 for the exam, diagnosis, procedures, and orders are all accurate and complete.    Maryella Shivers  [facility] [phone] (phone) [phone] (fax)  [facility]

## 2020-01-22 NOTE — Patient Instructions (Signed)
Voltaren gel - anti-inflammatory gel twice daily.

## 2020-02-08 ENCOUNTER — Other Ambulatory Visit: Payer: Self-pay | Admitting: Family Medicine

## 2020-02-08 DIAGNOSIS — I10 Essential (primary) hypertension: Secondary | ICD-10-CM

## 2020-02-08 NOTE — Telephone Encounter (Signed)
Pt has upcoming appt  Requested Prescriptions  Pending Prescriptions Disp Refills  . amLODipine (NORVASC) 5 MG tablet [Pharmacy Med Name: AMLODIPINE BESYLATE 5MG  TABLETS] 90 tablet 0    Sig: TAKE 1 TABLET BY MOUTH EVERY DAY     Cardiovascular:  Calcium Channel Blockers Passed - 02/08/2020 10:03 AM      Passed - Last BP in normal range    BP Readings from Last 1 Encounters:  01/22/20 129/87         Passed - Valid encounter within last 6 months    Recent Outpatient Visits          2 weeks ago Acute pain of left knee   [facility] [place], [place] M, PA-C   7 months ago Suspected COVID-19 virus infection   [facility] Lorenzo, [place] E, [place]   7 months ago Diarrhea, unspecified type   [facility] [place], [place], PA-C   1 year ago Acute midline low back pain without sciatica   [facility] [place], [place] E, [place]   1 year ago Closed displaced fracture of phalanx of left middle finger, unspecified phalanx, initial encounter   [facility] Fisher, [facility], MD      Future Appointments            In 3 days Fisher, Demetrios Isaacs, MD [facility]

## 2020-02-10 NOTE — Progress Notes (Signed)
I,Roshena L Chambers,acting as a scribe for [name], MD.,have documented all relevant documentation on the behalf of [name], MD,as directed by  [name], MD while in the presence of [name], MD.   Established patient visit   Patient: Andrew Green   DOB: [birth year]   54 y.o. Male  MRN: [id number] Visit Date: 02/11/2020  Today's healthcare provider: [name], MD   Chief Complaint  Patient presents with  . Hypertension  . Knee Pain    Subjective    HPI Hypertension, follow-up  BP Readings from Last 3 Encounters:  02/11/20 130/90  01/22/20 129/87  02/07/19 100/60   Wt Readings from Last 3 Encounters:  02/11/20 209 lb (94.8 kg)  01/22/20 207 lb (93.9 kg)  02/07/19 189 lb 3.2 oz (85.8 kg)     He was last seen for hypertension more 1 years ago.  BP at that visit was see above. Management since that visit includes no change.  He reports good compliance with treatment. He is not having side effects.  He is following a Regular diet. He is not exercising. He does not smoke.  Use of agents associated with hypertension: none.   Outside blood pressures are not checked. Symptoms: No chest pain No chest pressure  No palpitations No syncope  No dyspnea No orthopnea  No paroxysmal nocturnal dyspnea No lower extremity edema   Pertinent labs: Lab Results  Component Value Date   CHOL 152 10/04/2017   HDL 45 10/04/2017   LDLCALC 86 10/04/2017   TRIG 105 10/04/2017   CHOLHDL 3.4 10/04/2017   Lab Results  Component Value Date   NA 143 10/04/2017   K 3.6 10/04/2017   CREATININE 1.04 10/04/2017   GFRNONAA 83 10/04/2017   GFRAA 96 10/04/2017   GLUCOSE 87 10/04/2017     The 10-year ASCVD risk score Denman George DC Jr., et al., 2013) is: 4.4%   --------------------------------------------------------------------------------------------------- Follow up for Erectile Dysfunction:  The patient was last seen for this more 1 years ago. Changes  made at last visit include no change.  He reports good compliance with treatment. He feels that condition is Unchanged. He is not having side effects.   ----------------------------------------------------------------------------------------- Follow up for Testicular Hypofunction:  The patient was last seen for this  years ago. Changes made at last visit include starting Androderm patch 2mg  per 24 hour.  He reports poor compliance with treatment. Patient stopped taking medication shortly after trying medication. He feels that condition is Unchanged. He is not having side effects.   -----------------------------------------------------------------------------------------   Knee pain: Patient complains of left knee pain off and on for 3-4 months. He denies any injury to the knee. He describes the pain as a shooting pain. Pain worsens with bending knee and walking. He has tried using a knee brace to help with pain. He has also tried Aleve. Patient was seen for this problem 20 days ago by , PA-C and orthopedic referral was recommended. Patent would like to discus treatment options today. Pain also tends to be worse when in bed at night, but has started sleeping with pillow between his knees which he states has helped quite a bit.  It has improved somewhat over the last week since he has been wearing nee brace more consistently.       Medications: Outpatient Medications Prior to Visit  Medication Sig  . amLODipine (NORVASC) 5 MG tablet TAKE 1 TABLET BY MOUTH EVERY DAY  . tadalafil (ADCIRCA/CIALIS) 20 MG  tablet Take 0.5-1 tablets (10-20 mg total) by mouth every other day as needed for erectile dysfunction.  . Testosterone 20.25 MG/ACT (1.62%) GEL Place 2 Pump onto the skin daily. (Patient not taking: Reported on 01/22/2020)  . [DISCONTINUED] cyclobenzaprine (FLEXERIL) 10 MG tablet Take 1 tablet (10 mg total) by mouth 3 (three) times daily as needed for muscle spasms. (Patient  not taking: Reported on 01/22/2020)   No facility-administered medications prior to visit.    Review of Systems  Constitutional: Negative for appetite change, chills and fever.  Respiratory: Negative for chest tightness, shortness of breath and wheezing.   Cardiovascular: Negative for chest pain and palpitations.  Gastrointestinal: Negative for abdominal pain, nausea and vomiting.  Musculoskeletal: Positive for arthralgias (left knee pain).      Objective    BP 130/90 (BP Location: Right Arm, Cuff Size: Large)   Pulse 89   Temp 97.8 F (36.6 C) (Temporal)   Resp 16   Wt 209 lb (94.8 kg)   SpO2 97% Comment: room air  BMI 29.99 kg/m    Physical Exam   General: Appearance:     Overweight male in no acute distress  Eyes:    PERRL, conjunctiva/corneas clear, EOM's intact       Lungs:     Clear to auscultation bilaterally, respirations unlabored  Heart:    Normal heart rate. Normal rhythm. No murmurs, rubs, or gallops.   MS:   All extremities are intact. Tender over left medial quadraceps tendon. Negative McMurray's sign. Pain reproduced by knee extension against resistance.   Neurologic:   Awake, alert, oriented x 3. No apparent focal neurological           defect.        No results found for any visits on 02/11/20.  Assessment & Plan     1. Essential hypertension Well controlled.  Continue current medications.  He is going to work on diet and weight loss as he would like to get off of blood pressure medication - Lipid Profile - Comprehensive Metabolic Panel (CMET) - CBC with Differential  2. Acute pain of left knee Likely quadriceps tendonitis or MCL strain.   3. Tendonitis of left knee He has just recently starting wearing knee brace consistently. Recommend he continue to do so and can take OTC NSAIDs before bed for the next couple of weeks. If not continuing to improve then would recommend orthopedic referral.   4. ED (erectile dysfunction) of organic  origin refill - tadalafil (CIALIS) 20 MG tablet; Take 0.5-1 tablets (10-20 mg total) by mouth every other day as needed for erectile dysfunction.  Dispense: 10 tablet; Refill: 11  5. Need for hepatitis C screening test  - Hepatitis C antibody  6. Encounter for screening for HIV  - HIV Antibody (routine testing w rflx)  7. Prostate cancer screening  - PSA Total (Reflex To Free) ([facility] only)  8. History of COVID-19    Return in about 4 months (around 06/12/2020) for Yearly Physical.      The entirety of the information documented in the History of Present Illness, Review of Systems and Physical Exam were personally obtained by me. Portions of this information were initially documented by the CMA and reviewed by me for thoroughness and accuracy.      Lelon Huh, MD  [facility] [phone] (phone) [phone] (fax)  [place]

## 2020-02-11 ENCOUNTER — Other Ambulatory Visit: Payer: Self-pay

## 2020-02-11 ENCOUNTER — Ambulatory Visit: Payer: BC Managed Care – PPO | Admitting: Family Medicine

## 2020-02-11 ENCOUNTER — Encounter: Payer: Self-pay | Admitting: Family Medicine

## 2020-02-11 VITALS — BP 130/90 | HR 89 | Temp 97.8°F | Resp 16 | Wt 209.0 lb

## 2020-02-11 DIAGNOSIS — Z8616 Personal history of COVID-19: Secondary | ICD-10-CM | POA: Insufficient documentation

## 2020-02-11 DIAGNOSIS — M25562 Pain in left knee: Secondary | ICD-10-CM

## 2020-02-11 DIAGNOSIS — Z1159 Encounter for screening for other viral diseases: Secondary | ICD-10-CM

## 2020-02-11 DIAGNOSIS — N529 Male erectile dysfunction, unspecified: Secondary | ICD-10-CM

## 2020-02-11 DIAGNOSIS — I10 Essential (primary) hypertension: Secondary | ICD-10-CM

## 2020-02-11 DIAGNOSIS — M76892 Other specified enthesopathies of left lower limb, excluding foot: Secondary | ICD-10-CM | POA: Diagnosis not present

## 2020-02-11 DIAGNOSIS — Z125 Encounter for screening for malignant neoplasm of prostate: Secondary | ICD-10-CM

## 2020-02-11 DIAGNOSIS — Z114 Encounter for screening for human immunodeficiency virus [HIV]: Secondary | ICD-10-CM

## 2020-02-11 HISTORY — DX: Personal history of COVID-19: Z86.16

## 2020-02-11 MED ORDER — TADALAFIL 20 MG PO TABS: 10.0000 mg | ORAL_TABLET | ORAL | 11 refills | Status: DC | PRN

## 2020-02-11 NOTE — Patient Instructions (Addendum)
•   Covid-19 vaccines: The Covid vaccines have been given to hundreds of millions of people and found to be very effective and are as safe as any other vaccine.  The [employer] vaccine has been associated with very rare dangerous blood clots, but only in adult women under the age of 53.  The risk of dying from Covid infections is much higher than having a serious reaction to the vaccine.  I strongly recommend getting fully vaccinated against Covid-19.  I recommend that adult women under 60 get fully vaccinated, but the [place] and [employer] vaccines may be safer for those women than the [employer] vaccine.    Please go to the lab draw station in [address] on the second floor of [facility]  when you are fasting for 8 hours. Normal hours are 8:00am to 12:30pm and 1:30pm to 4:00pm Monday through Friday

## 2020-02-18 DIAGNOSIS — Z1159 Encounter for screening for other viral diseases: Secondary | ICD-10-CM | POA: Diagnosis not present

## 2020-02-18 DIAGNOSIS — I10 Essential (primary) hypertension: Secondary | ICD-10-CM | POA: Diagnosis not present

## 2020-02-18 DIAGNOSIS — Z114 Encounter for screening for human immunodeficiency virus [HIV]: Secondary | ICD-10-CM | POA: Diagnosis not present

## 2020-02-18 DIAGNOSIS — Z125 Encounter for screening for malignant neoplasm of prostate: Secondary | ICD-10-CM | POA: Diagnosis not present

## 2020-02-19 LAB — COMPREHENSIVE METABOLIC PANEL
ALT: 18 IU/L (ref 0–44)
AST: 16 IU/L (ref 0–40)
Albumin/Globulin Ratio: 1.8 (ref 1.2–2.2)
Albumin: 4.1 g/dL (ref 3.8–4.9)
Alkaline Phosphatase: 73 IU/L (ref 48–121)
BUN/Creatinine Ratio: 15 (ref 9–20)
BUN: 15 mg/dL (ref 6–24)
Bilirubin Total: 0.4 mg/dL (ref 0.0–1.2)
CO2: 25 mmol/L (ref 20–29)
Calcium: 8.6 mg/dL — ABNORMAL LOW (ref 8.7–10.2)
Chloride: 102 mmol/L (ref 96–106)
Creatinine, Ser: 1 mg/dL (ref 0.76–1.27)
GFR calc Af Amer: 99 mL/min/{1.73_m2} (ref >59)
GFR calc non Af Amer: 86 mL/min/{1.73_m2} (ref >59)
Globulin, Total: 2.3 g/dL (ref 1.5–4.5)
Glucose: 92 mg/dL (ref 65–99)
Potassium: 3.9 mmol/L (ref 3.5–5.2)
Sodium: 141 mmol/L (ref 134–144)
Total Protein: 6.4 g/dL (ref 6.0–8.5)

## 2020-02-19 LAB — CBC WITH DIFFERENTIAL/PLATELET
Basophils Absolute: 0 10*3/uL (ref 0.0–0.2)
Basos: 1 %
EOS (ABSOLUTE): 0.2 10*3/uL (ref 0.0–0.4)
Eos: 4 %
Hematocrit: 44.4 % (ref 37.5–51.0)
Hemoglobin: 15.3 g/dL (ref 13.0–17.7)
Immature Grans (Abs): 0 10*3/uL (ref 0.0–0.1)
Immature Granulocytes: 0 %
Lymphocytes Absolute: 2 10*3/uL (ref 0.7–3.1)
Lymphs: 35 %
MCH: 30.7 pg (ref 26.6–33.0)
MCHC: 34.5 g/dL (ref 31.5–35.7)
MCV: 89 fL (ref 79–97)
Monocytes Absolute: 0.4 10*3/uL (ref 0.1–0.9)
Monocytes: 7 %
Neutrophils Absolute: 3.1 10*3/uL (ref 1.4–7.0)
Neutrophils: 53 %
Platelets: 238 10*3/uL (ref 150–450)
RBC: 4.99 x10E6/uL (ref 4.14–5.80)
RDW: 12.8 % (ref 11.6–15.4)
WBC: 5.8 10*3/uL (ref 3.4–10.8)

## 2020-02-19 LAB — LIPID PANEL
Chol/HDL Ratio: 2.9 ratio (ref 0.0–5.0)
Cholesterol, Total: 167 mg/dL (ref 100–199)
HDL: 58 mg/dL (ref >39)
LDL Chol Calc (NIH): 97 mg/dL (ref 0–99)
Triglycerides: 59 mg/dL (ref 0–149)
VLDL Cholesterol Cal: 12 mg/dL (ref 5–40)

## 2020-02-19 LAB — PSA TOTAL (REFLEX TO FREE): Prostate Specific Ag, Serum: 1.9 ng/mL (ref 0.0–4.0)

## 2020-02-19 LAB — HIV ANTIBODY (ROUTINE TESTING W REFLEX): HIV Screen 4th Generation wRfx: NONREACTIVE

## 2020-02-19 LAB — HEPATITIS C ANTIBODY: Hep C Virus Ab: <0.1 s/co ratio (ref 0.0–0.9)

## 2020-04-07 ENCOUNTER — Ambulatory Visit: Payer: BC Managed Care – PPO | Admitting: Family Medicine

## 2020-04-07 NOTE — Progress Notes (Deleted)
     Established patient visit   Patient: Andrew Green   DOB: 02-21-1966   54 y.o. Male  MRN: [id number] Visit Date: 04/07/2020  Today's healthcare provider: Shirlee Latch, MD   No chief complaint on file.  Subjective    HPI  ***  {Show patient history (optional):23778::" "}   Medications: Outpatient Medications Prior to Visit  Medication Sig  . amLODipine (NORVASC) 5 MG tablet TAKE 1 TABLET BY MOUTH EVERY DAY  . tadalafil (CIALIS) 20 MG tablet Take 0.5-1 tablets (10-20 mg total) by mouth every other day as needed for erectile dysfunction.   No facility-administered medications prior to visit.    Review of Systems  {Heme  Chem  Endocrine  Serology  Results Review (optional):23779::" "}  Objective    There were no vitals taken for this visit. {Show previous vital signs (optional):23777::" "}  Physical Exam  ***  No results found for any visits on 04/07/20.  Assessment & Plan     ***  No follow-ups on file.      {provider attestation***:1}   Shirlee Latch, MD  [facility] [phone] (phone) [phone] (fax)  [facility]

## 2020-04-12 ENCOUNTER — Ambulatory Visit: Payer: BC Managed Care – PPO | Admitting: Family Medicine

## 2020-05-09 ENCOUNTER — Other Ambulatory Visit: Payer: Self-pay | Admitting: Family Medicine

## 2020-05-09 DIAGNOSIS — I10 Essential (primary) hypertension: Secondary | ICD-10-CM

## 2020-06-11 NOTE — Patient Instructions (Addendum)
.   Please review the attached list of medications and notify my office if there are any errors.    . Covid-19 vaccines: The Covid vaccines have been given to hundreds of millions of people and found to be very effective and are as safe as any other vaccine.  The [employer] vaccine has been associated with very rare dangerous blood clots, but only in adult women under the age of 25.  The risk of dying from Covid infections is much higher than having a serious reaction to the vaccine.  I strongly recommend getting fully vaccinated against Covid-19.  I recommend that adult women under 60 get fully vaccinated, but the [place] and [employer] vaccines may be safer for those women than the [employer] vaccine.

## 2020-06-14 ENCOUNTER — Other Ambulatory Visit: Payer: Self-pay

## 2020-06-14 ENCOUNTER — Encounter: Payer: Self-pay | Admitting: Family Medicine

## 2020-06-14 ENCOUNTER — Ambulatory Visit (INDEPENDENT_AMBULATORY_CARE_PROVIDER_SITE_OTHER): Payer: BC Managed Care – PPO | Admitting: Family Medicine

## 2020-06-14 VITALS — BP 135/97 | HR 97 | Temp 98.2°F | Resp 16 | Ht 70.0 in | Wt 215.8 lb

## 2020-06-14 DIAGNOSIS — Z0000 Encounter for general adult medical examination without abnormal findings: Secondary | ICD-10-CM | POA: Diagnosis not present

## 2020-06-14 DIAGNOSIS — K219 Gastro-esophageal reflux disease without esophagitis: Secondary | ICD-10-CM

## 2020-06-14 DIAGNOSIS — Z23 Encounter for immunization: Secondary | ICD-10-CM

## 2020-06-14 DIAGNOSIS — I10 Essential (primary) hypertension: Secondary | ICD-10-CM

## 2020-06-14 NOTE — Progress Notes (Signed)
Complete physical exam   Patient: Andrew Green   DOB: 14-Feb-1966   54 y.o. Male  MRN: [id number] Visit Date: 06/14/2020  Today's healthcare provider: [name], MD   Chief Complaint  Patient presents with  . Annual Exam  . Hypertension   Subjective    Andrew Green is a 54 y.o. male who presents today for a complete physical exam.  He reports consuming a general diet. Home exercise routine includes walks at work daily. He generally feels well. He reports sleeping well. He does have additional problems to discuss today. Patient Declined Influenza vaccine. HPI  Hypertension, follow-up  BP Readings from Last 3 Encounters:  06/14/20 (!) 135/97  02/11/20 130/90  01/22/20 129/87   Wt Readings from Last 3 Encounters:  06/14/20 215 lb 12.8 oz (97.9 kg)  02/11/20 209 lb (94.8 kg)  01/22/20 207 lb (93.9 kg)     He was last seen for hypertension 4 months ago.  BP at that visit was 130/90. Management since that visit includes counseling patient to work on diet and weight loss.  He reports excellent compliance with treatment. He is not having side effects.  He is following a Regular diet. He is not exercising. He does not smoke.  Outside blood pressures are not being checked. Symptoms: No chest pain No chest pressure  No palpitations No syncope  No dyspnea No orthopnea  No paroxysmal nocturnal dyspnea No lower extremity edema   Pertinent labs: Lab Results  Component Value Date   CHOL 167 02/18/2020   HDL 58 02/18/2020   LDLCALC 97 02/18/2020   TRIG 59 02/18/2020   CHOLHDL 2.9 02/18/2020   Lab Results  Component Value Date   NA 141 02/18/2020   K 3.9 02/18/2020   CREATININE 1.00 02/18/2020   GFRNONAA 86 02/18/2020   GFRAA 99 02/18/2020   GLUCOSE 92 02/18/2020     The 10-year ASCVD risk score Denman George DC Jr., et al., 2013) is: 4.1%   ---------------------------------------------------------------------------------------------------  Follow up for  ED:  The patient was last seen for this 4 months ago. Changes made at last visit include none; refilled Cialis.  He reports excellent compliance with treatment. He feels that condition is Improved. He is having side effects. Some acid reflux.  -----------------------------------------------------------------------------------------  Only other complaint today is occasional left knee pain, no swelling and only occasionally limits activity.   Past Medical History:  Diagnosis Date  . History of chicken pox   . History of measles   . History of mumps    Past Surgical History:  Procedure Laterality Date  . HERNIA REPAIR  1970   Social History   Socioeconomic History  . Marital status: Married    Spouse name: Not on file  . Number of children: 2  . Years of education: Not on file  . Highest education level: Not on file  Occupational History  . Occupation: Chartered certified accountant  Tobacco Use  . Smoking status: Never Smoker  . Smokeless tobacco: Never Used  Substance and Sexual Activity  . Alcohol use: Not Currently    Alcohol/week: 0.0 standard drinks    Comment: 2-3 times a year  . Drug use: No  . Sexual activity: Not on file  Other Topics Concern  . Not on file  Social History Narrative  . Not on file   Social Determinants of Health   Financial Resource Strain:   . Difficulty of Paying Living Expenses: Not on file  Food Insecurity:   .  Worried About Programme researcher, broadcasting/film/video in the Last Year: Not on file  . Ran Out of Food in the Last Year: Not on file  Transportation Needs:   . Lack of Transportation (Medical): Not on file  . Lack of Transportation (Non-Medical): Not on file  Physical Activity:   . Days of Exercise per Week: Not on file  . Minutes of Exercise per Session: Not on file  Stress:   . Feeling of Stress : Not on file  Social Connections:   . Frequency of Communication with Friends and Family: Not on file  . Frequency of Social Gatherings with Friends and Family:  Not on file  . Attends Religious Services: Not on file  . Active Member of Clubs or Organizations: Not on file  . Attends Banker Meetings: Not on file  . Marital Status: Not on file  Intimate Partner Violence:   . Fear of Current or Ex-Partner: Not on file  . Emotionally Abused: Not on file  . Physically Abused: Not on file  . Sexually Abused: Not on file   Family Status  Relation Name Status  . Mother  Alive  . Father  Deceased  . Sister  Alive  . MGM  Deceased  . PGM  Deceased  . Daughter  Alive  . Daughter  Alive  . Neg Hx  (Not Specified)   Family History  Problem Relation Age of Onset  . Cervical cancer Mother   . Colon polyps Father   . Lung cancer Maternal Grandmother   . Brain cancer Maternal Grandmother   . Congestive Heart Failure Paternal Grandmother   . CAD Neg Hx    Allergies  Allergen Reactions  . Other     Patient Care Team: Malva Limes, MD as PCP - General (Family Medicine) Deirdre Evener, MD (Dermatology)   Medications: Outpatient Medications Prior to Visit  Medication Sig  . amLODipine (NORVASC) 5 MG tablet TAKE 1 TABLET BY MOUTH EVERY DAY  . tadalafil (CIALIS) 20 MG tablet Take 0.5-1 tablets (10-20 mg total) by mouth every other day as needed for erectile dysfunction.   No facility-administered medications prior to visit.    Review of Systems  Constitutional: Negative for appetite change, chills, fatigue and fever.  HENT: Negative for congestion, ear pain, hearing loss, nosebleeds and trouble swallowing.   Eyes: Negative for pain and visual disturbance.  Respiratory: Negative for cough, chest tightness and shortness of breath.   Cardiovascular: Negative for chest pain, palpitations and leg swelling.  Gastrointestinal: Negative for abdominal pain, blood in stool, constipation, diarrhea, nausea and vomiting.  Endocrine: Negative for polydipsia, polyphagia and polyuria.  Genitourinary: Negative for dysuria and flank pain.   Musculoskeletal: Negative for arthralgias, back pain, joint swelling, myalgias and neck stiffness.  Skin: Negative for color change, rash and wound.  Neurological: Negative for dizziness, tremors, seizures, speech difficulty, weakness, light-headedness and headaches.  Psychiatric/Behavioral: Negative for behavioral problems, confusion, decreased concentration, dysphoric mood and sleep disturbance. The patient is not nervous/anxious.   All other systems reviewed and are negative.     Objective    BP (!) 135/97 (BP Location: Left Arm, Patient Position: Sitting, Cuff Size: Large)   Pulse 97   Temp 98.2 F (36.8 C) (Oral)   Resp 16   Ht 5\' 10"  (1.778 m)   Wt 215 lb 12.8 oz (97.9 kg)   BMI 30.96 kg/m    Physical Exam   General Appearance:    Mildly obese male.  Alert, cooperative, in no acute distress, appears stated age  Head:    Normocephalic, without obvious abnormality, atraumatic  Eyes:    PERRL, conjunctiva/corneas clear, EOM's intact, fundi    benign, both eyes       Ears:    Normal TM's and external ear canals, both ears  Neck:   Supple, symmetrical, trachea midline, no adenopathy;       thyroid:  No enlargement/tenderness/nodules; no carotid   bruit or JVD  Back:     Symmetric, no curvature, ROM normal, no CVA tenderness  Lungs:     Clear to auscultation bilaterally, respirations unlabored  Chest wall:    No tenderness or deformity  Heart:    Normal heart rate. Normal rhythm. No murmurs, rubs, or gallops.  S1 and S2 normal  Abdomen:     Soft, non-tender, bowel sounds active all four quadrants,    no masses, no organomegaly  Genitalia:    deferred  Rectal:    deferred  Extremities:   All extremities are intact. No cyanosis or edema  Pulses:   2+ and symmetric all extremities  Skin:   Skin color, texture, turgor normal, no rashes or lesions  Lymph nodes:   Cervical, supraclavicular, and axillary nodes normal  Neurologic:   CNII-XII intact. Normal strength, sensation and  reflexes      throughout     Last depression screening scores PHQ 2/9 Scores 06/18/2020 02/11/2020 09/28/2017  PHQ - 2 Score 0 0 0  PHQ- 9 Score - - 2   Last Audit-C alcohol use screening Alcohol Use Disorder Test (AUDIT) 06/18/2020  1. How often do you have a drink containing alcohol? 0  2. How many drinks containing alcohol do you have on a typical day when you are drinking? 0  3. How often do you have six or more drinks on one occasion? 0  AUDIT-C Score 0   A score of 3 or more in women, and 4 or more in men indicates increased risk for alcohol abuse, EXCEPT if all of the points are from question 1     Assessment & Plan    Routine Health Maintenance and Physical Exam  Exercise Activities and Dietary recommendations Goals   None     Immunization History  Administered Date(s) Administered  . Influenza-Unspecified 06/15/2017, 06/18/2018  . Tdap 12/20/2009, 05/26/2018    Health Maintenance  Topic Date Due  . COVID-19 Vaccine (1) Never done  . INFLUENZA VACCINE  03/21/2020  . COLONOSCOPY  12/14/2021  . TETANUS/TDAP  05/26/2028  . Hepatitis C Screening  Completed  . HIV Screening  Completed    Discussed health benefits of physical activity, and encouraged him to engage in regular exercise appropriate for his age and condition.  1. Annual physical exam Generally doing well. Is mildly obese and counseled on prudent diet and exercise recommendations.   2. Essential hypertension Fairly well controlled.  - EKG 12-Lead  3. Gastroesophageal reflux disease, unspecified whether esophagitis present Well controlled with diet and occasional OTC acid reducing medications.   4. Need for influenza vaccination Recommend flu vaccine which he declined.   5. Need for shingles vaccine Counseled regarding flu vaccine which is going to consider.   Recommended complete Covid vaccine series.        The entirety of the information documented in the History of Present Illness,  Review of Systems and Physical Exam were personally obtained by me. Portions of this information were initially documented by the CMA and reviewed  by me for thoroughness and accuracy.      Lelon Huh, MD  [facility] [phone] (phone) [phone] (fax)  [place]

## 2020-07-25 IMAGING — DX DG FINGER MIDDLE 2+V*L*
3 series · 3 of 3 positions shown · non-contrast
Comparison: None

CLINICAL DATA: Laceration LEFT middle finger nailbed, caught finger
between a door frame and door this morning

EXAM:
LEFT MIDDLE FINGER 2+V

[finger ap]
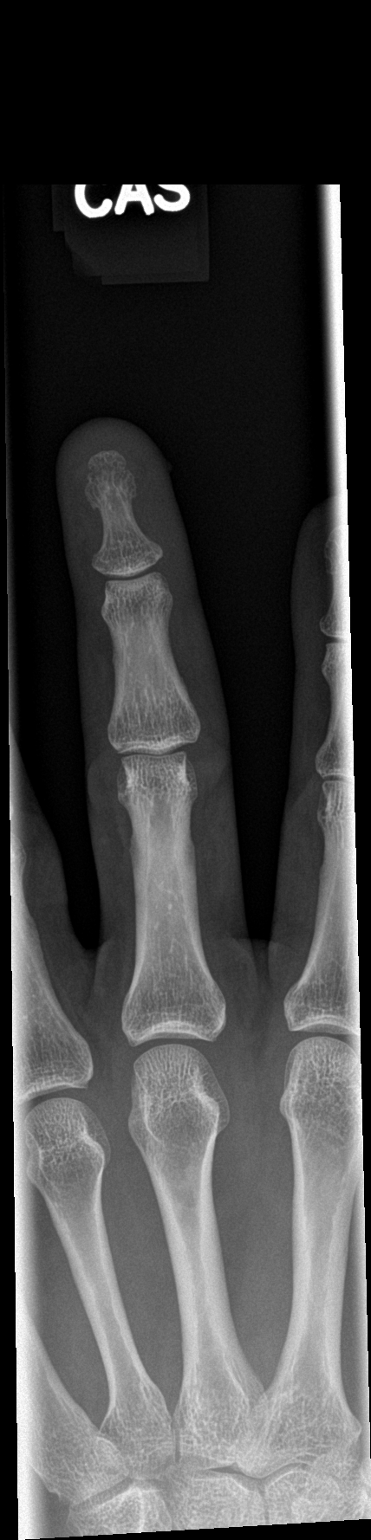

[finger obl]
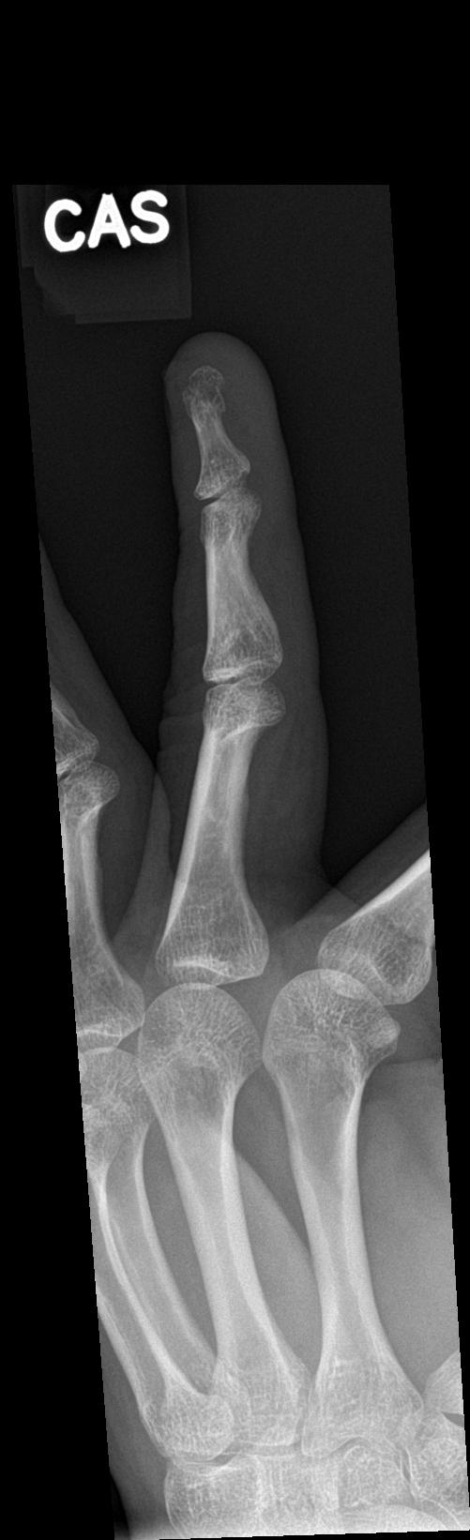

[finger lat]
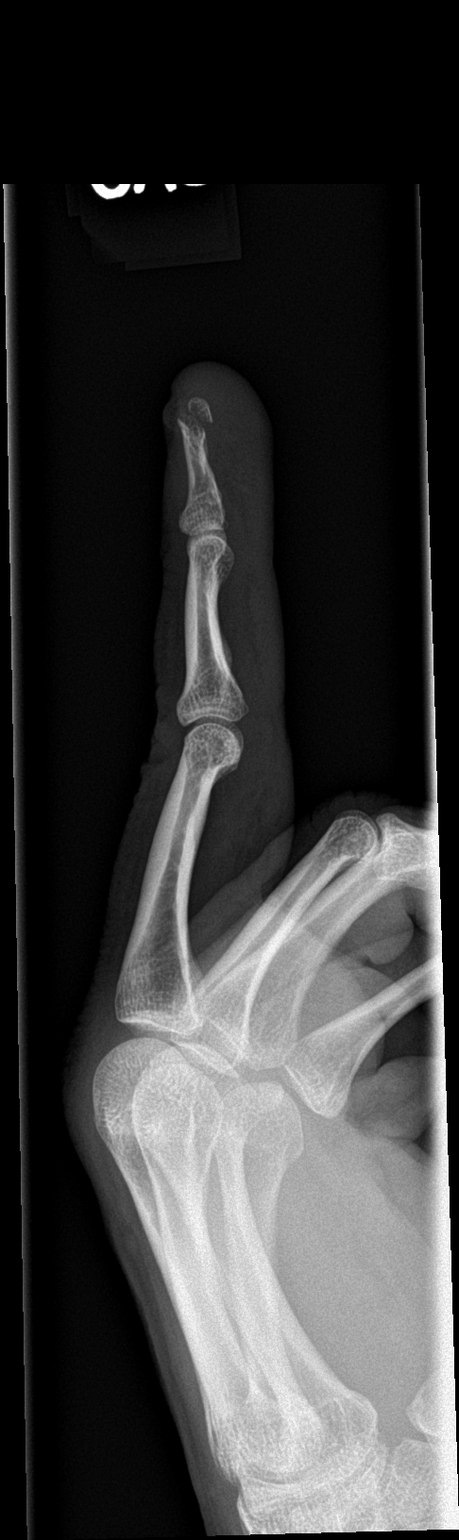

[3 of 3 positions shown; findings below may reference images not displayed]

FINDINGS: Soft tissue swelling at distal phalanx LEFT middle finger.

Osseous mineralization normal.

Joint spaces preserved.

Displaced tuft fracture at distal phalanx.

No additional fracture, dislocation or bone destruction.
IMPRESSION: Displaced tuft fracture at distal phalanx LEFT middle finger.

## 2020-08-04 IMAGING — CR DG FINGER MIDDLE 2+V*L*
1 series · 3 of 3 positions shown · non-contrast
Comparison: None

CLINICAL DATA: Caught LEFT middle finger in door on 05/26/2018,
distal phalanx fracture, follow-up

EXAM:
LEFT MIDDLE FINGER 2+V

[Series 1: dg finger middle left · 0.14mm/px · 3 of 3 slices shown]
[im 1/3]
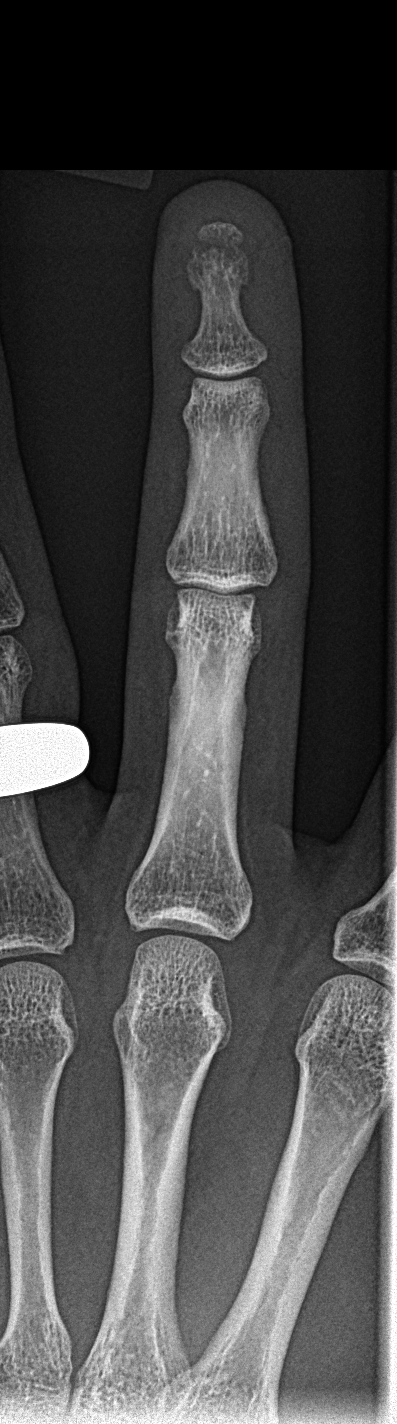
[im 2/3]
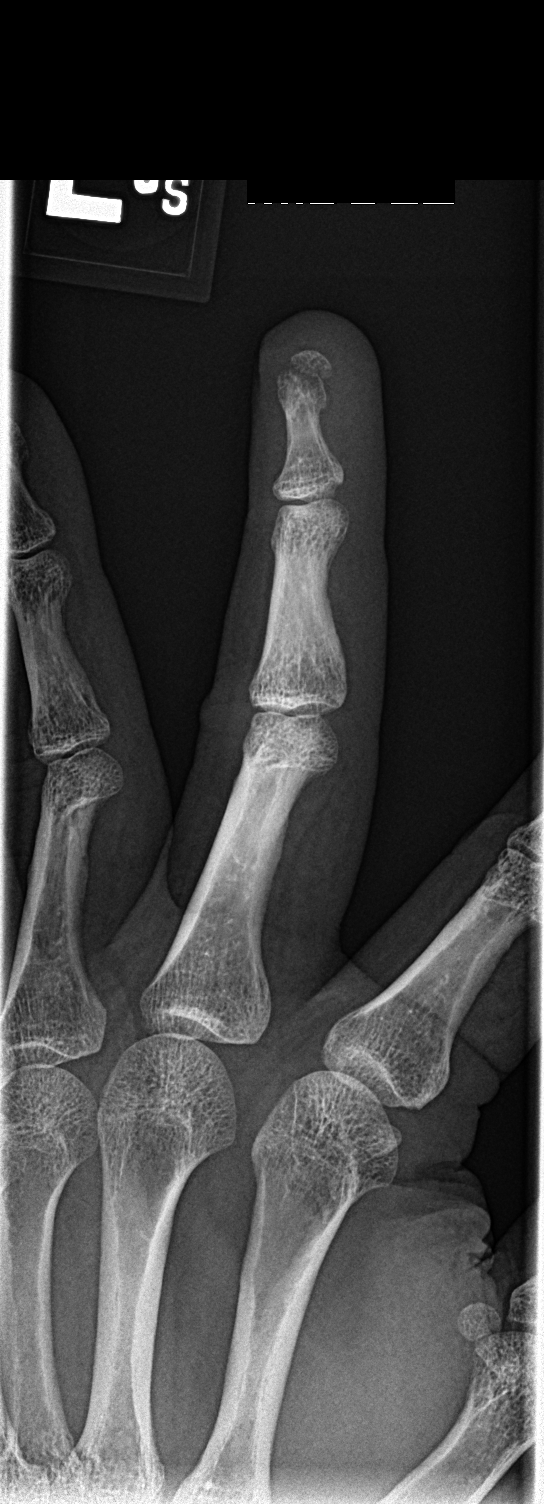
[im 3/3]
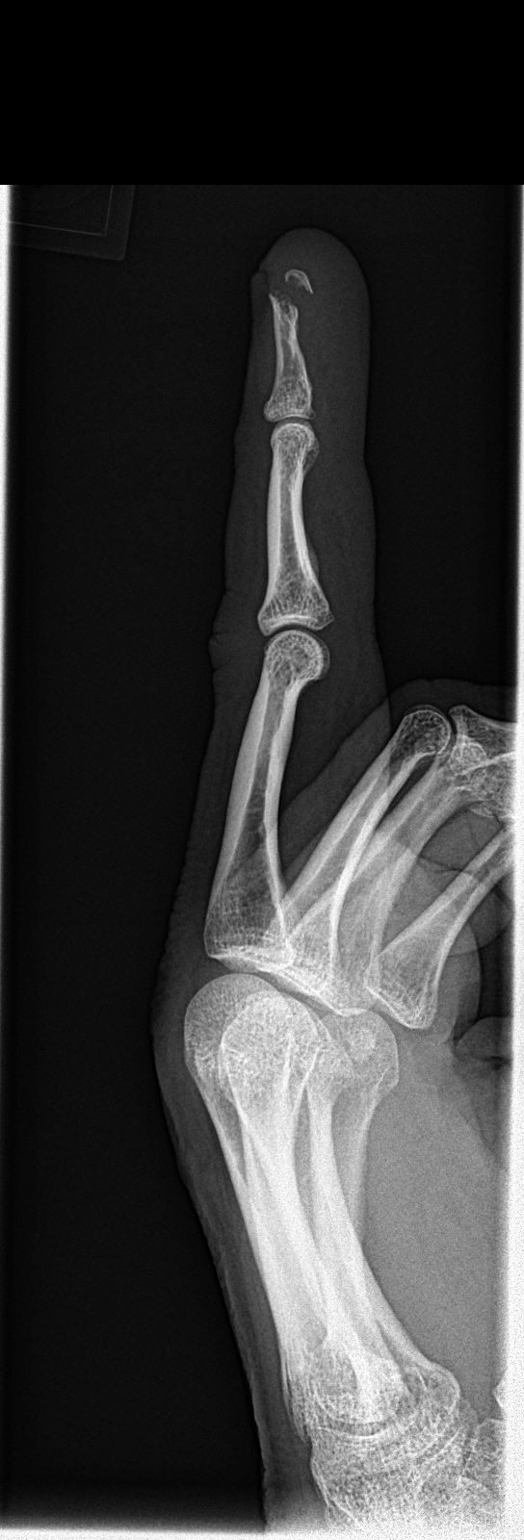

[3 of 3 positions shown; findings below may reference images not displayed]

FINDINGS: Osseous mineralization normal.

Joint spaces preserved.

Bony resorption identified at the fracture at the distal aspect of
the tuft of the distal phalanx.

No additional fracture, dislocation, or bone destruction.
IMPRESSION: Subacute transverse fracture at tuft of distal phalanx LEFT middle
finger.

## 2020-08-09 ENCOUNTER — Other Ambulatory Visit: Payer: Self-pay | Admitting: Family Medicine

## 2020-08-09 DIAGNOSIS — I10 Essential (primary) hypertension: Secondary | ICD-10-CM

## 2021-02-03 ENCOUNTER — Other Ambulatory Visit: Payer: Self-pay | Admitting: Family Medicine

## 2021-02-03 DIAGNOSIS — I10 Essential (primary) hypertension: Secondary | ICD-10-CM

## 2021-02-03 NOTE — Telephone Encounter (Signed)
Courtesy refill. Patient needs an office visit for further refills. Requested Prescriptions  Pending Prescriptions Disp Refills  . amLODipine (NORVASC) 5 MG tablet [Pharmacy Med Name: AMLODIPINE BESYLATE 5MG  TABLETS] 30 tablet 0    Sig: TAKE 1 TABLET BY MOUTH EVERY DAY     Cardiovascular:  Calcium Channel Blockers Failed - 02/03/2021  3:12 AM      Failed - Last BP in normal range    BP Readings from Last 1 Encounters:  06/14/20 (!) 135/97         Failed - Valid encounter within last 6 months    Recent Outpatient Visits          7 months ago Annual physical exam   [facility], MD   11 months ago Essential hypertension   [facility], MD   1 year ago Acute pain of left knee   [facility], Trey Sailors   1 year ago Suspected COVID-19 virus infection   [facility] [place], [facility], PA-C   1 year ago Diarrhea, unspecified type   [facility] [place], [place], [place]

## 2021-03-04 ENCOUNTER — Other Ambulatory Visit: Payer: Self-pay | Admitting: Family Medicine

## 2021-03-04 DIAGNOSIS — I10 Essential (primary) hypertension: Secondary | ICD-10-CM

## 2021-03-04 NOTE — Telephone Encounter (Signed)
   Notes to clinic:  Courtesy refill has been given  No appt has been scheduled    Requested Prescriptions  Pending Prescriptions Disp Refills   amLODipine (NORVASC) 5 MG tablet [Pharmacy Med Name: AMLODIPINE BESYLATE 5MG  TABLETS] 30 tablet 0    Sig: TAKE 1 TABLET BY MOUTH EVERY DAY      Cardiovascular:  Calcium Channel Blockers Failed - 03/04/2021  3:13 AM      Failed - Last BP in normal range    BP Readings from Last 1 Encounters:  06/14/20 (!) 135/97          Failed - Valid encounter within last 6 months    Recent Outpatient Visits           8 months ago Annual physical exam   [facility], MD   1 year ago Essential hypertension   [facility], MD   1 year ago Acute pain of left knee   [facility] [place], [place], Lavella Hammock   1 year ago Suspected COVID-19 virus infection   [facility] [place], [facility], PA-C   1 year ago Diarrhea, unspecified type   [facility] [place], [place], [place]

## 2021-04-04 ENCOUNTER — Other Ambulatory Visit: Payer: Self-pay | Admitting: Family Medicine

## 2021-04-04 DIAGNOSIS — I10 Essential (primary) hypertension: Secondary | ICD-10-CM

## 2021-07-01 ENCOUNTER — Other Ambulatory Visit: Payer: Self-pay | Admitting: Family Medicine

## 2021-07-01 DIAGNOSIS — I10 Essential (primary) hypertension: Secondary | ICD-10-CM

## 2021-07-01 NOTE — Telephone Encounter (Signed)
Requested medications are due for refill today NO  Requested medications are on the active medication list yes  Last refill 06/03/21 for 90 day supply  Last visit 06/14/20  Future visit scheduled NO  Notes to clinic filled 06/05/21 for 90 day supply, no visit in more than one year, please assess.  Requested Prescriptions  Pending Prescriptions Disp Refills   amLODipine (NORVASC) 5 MG tablet [Pharmacy Med Name: AMLODIPINE BESYLATE 5MG  TABLETS] 90 tablet 0    Sig: TAKE 1 TABLET(5 MG) BY MOUTH DAILY     Cardiovascular:  Calcium Channel Blockers Failed - 07/01/2021  3:13 AM      Failed - Last BP in normal range    BP Readings from Last 1 Encounters:  06/14/20 (!) 135/97          Failed - Valid encounter within last 6 months    Recent Outpatient Visits           1 year ago Annual physical exam   [facility], MD   1 year ago Essential hypertension   [facility], MD   1 year ago Acute pain of left knee   [facility] [place], [place], Lavella Hammock   1 year ago Suspected COVID-19 virus infection   [facility] [place], [facility], PA-C   2 years ago Diarrhea, unspecified type   [facility] [place], [place], [place]

## 2021-07-30 ENCOUNTER — Other Ambulatory Visit: Payer: Self-pay | Admitting: Family Medicine

## 2021-07-30 DIAGNOSIS — I10 Essential (primary) hypertension: Secondary | ICD-10-CM

## 2021-07-30 NOTE — Telephone Encounter (Signed)
Requested medication (s) are due for refill today: yes  Requested medication (s) are on the active medication list: yes  Last refill:  07/01/21  Future visit scheduled: NO  Notes to clinic:  Has already had a curtesy refill and there is no upcoming appointment scheduled.  Requested Prescriptions  Pending Prescriptions Disp Refills   amLODipine (NORVASC) 5 MG tablet [Pharmacy Med Name: AMLODIPINE BESYLATE 5MG  TABLETS] 30 tablet 0    Sig: TAKE 1 TABLET(5 MG) BY MOUTH DAILY     Cardiovascular:  Calcium Channel Blockers Failed - 07/30/2021  3:13 AM      Failed - Last BP in normal range    BP Readings from Last 1 Encounters:  06/14/20 (!) 135/97          Failed - Valid encounter within last 6 months    Recent Outpatient Visits           1 year ago Annual physical exam   [facility], MD   1 year ago Essential hypertension   [facility], MD   1 year ago Acute pain of left knee   [facility] [place], [place] M, M   2 years ago Suspected COVID-19 virus infection   [facility] [place], [facility], PA-C   2 years ago Diarrhea, unspecified type   [facility] [place], [place], [place]

## 2021-08-01 NOTE — Telephone Encounter (Signed)
Pt called regarding denied refill. Advised that a physical was needed to get more refills. Scheduled for first available in April with Dr. Sherrie Mustache. Pt is out of blood pressure medication. Please advise.

## 2021-08-01 NOTE — Telephone Encounter (Signed)
Patient called back and has been scheduled- is it ok to extend RF until that appointment- patient has already has courtesy RF. Request sent for review

## 2021-08-02 NOTE — Telephone Encounter (Signed)
Requested medication (s) are due for refill today: yes  Requested medication (s) are on the active medication list: yes  Future visit scheduled: was asked to schedule appt, there is one for 12/05/2021, question if that was first available, pt last seen 06/14/2020.  Notes to clinic:  Please assess if appt in April 2023 qualifies for refill since pt not seen since 2021.    Requested Prescriptions  Refused Prescriptions Disp Refills   amLODipine (NORVASC) 5 MG tablet [Pharmacy Med Name: AMLODIPINE BESYLATE 5MG  TABLETS] 30 tablet 0    Sig: TAKE 1 TABLET(5 MG) BY MOUTH DAILY     Cardiovascular:  Calcium Channel Blockers Failed - 07/30/2021  3:13 AM      Failed - Last BP in normal range    BP Readings from Last 1 Encounters:  06/14/20 (!) 135/97          Failed - Valid encounter within last 6 months    Recent Outpatient Visits           1 year ago Annual physical exam   [facility], MD   1 year ago Essential hypertension   [facility], MD   1 year ago Acute pain of left knee   [facility] [place], [place] M, M   2 years ago Suspected COVID-19 virus infection   [facility] [place], [facility], PA-C   2 years ago Diarrhea, unspecified type   [facility] [place], [place], PA-C       Future Appointments             In 4 months Fisher, Lavella Hammock, MD [facility]

## 2021-08-08 ENCOUNTER — Telehealth: Payer: Self-pay | Admitting: Family Medicine

## 2021-08-08 DIAGNOSIS — I10 Essential (primary) hypertension: Secondary | ICD-10-CM

## 2021-08-08 MED ORDER — AMLODIPINE BESYLATE 5 MG PO TABS: 5.0000 mg | ORAL_TABLET | Freq: Every day | ORAL | 0 refills | Status: DC

## 2021-08-08 NOTE — Telephone Encounter (Signed)
Medication Refill - Medication:  amLODipine (NORVASC) 5 MG tablet   Has the patient contacted their pharmacy? Yes.   Was refused due to needing an appt, but pt now has appt in April, but needs a refill to hold im until  Preferred Pharmacy (with phone number or street name):  [id number] [place], [place] - [address] AT [facility] [address] [facility] Phone:  [phone]  Fax:  [phone]     Has the patient been seen for an appointment in the last year OR does the patient have an upcoming appointment? Yes.    Agent: Please be advised that RX refills may take up to 3 business days. We ask that you follow-up with your pharmacy.

## 2021-09-05 ENCOUNTER — Other Ambulatory Visit: Payer: Self-pay | Admitting: Family Medicine

## 2021-09-05 DIAGNOSIS — I10 Essential (primary) hypertension: Secondary | ICD-10-CM

## 2021-11-28 ENCOUNTER — Other Ambulatory Visit: Payer: Self-pay | Admitting: Family Medicine

## 2021-11-28 DIAGNOSIS — I10 Essential (primary) hypertension: Secondary | ICD-10-CM

## 2021-12-05 ENCOUNTER — Encounter: Payer: Self-pay | Admitting: Family Medicine

## 2021-12-05 ENCOUNTER — Ambulatory Visit (INDEPENDENT_AMBULATORY_CARE_PROVIDER_SITE_OTHER): Payer: BC Managed Care – PPO | Admitting: Family Medicine

## 2021-12-05 VITALS — BP 118/80 | HR 95 | Temp 98.1°F | Resp 16 | Wt 217.3 lb

## 2021-12-05 DIAGNOSIS — M722 Plantar fascial fibromatosis: Secondary | ICD-10-CM

## 2021-12-05 DIAGNOSIS — Z0000 Encounter for general adult medical examination without abnormal findings: Secondary | ICD-10-CM

## 2021-12-05 DIAGNOSIS — Z125 Encounter for screening for malignant neoplasm of prostate: Secondary | ICD-10-CM

## 2021-12-05 DIAGNOSIS — I10 Essential (primary) hypertension: Secondary | ICD-10-CM

## 2021-12-05 NOTE — Progress Notes (Signed)
? ? ? ?I,Andrew Green,acting as a scribe for Andrew Merry, MD.,have documented all relevant documentation on the behalf of Andrew Merry, MD,as directed by  Andrew Merry, MD while in the presence of Andrew Merry, MD. ? ?Complete physical exam ? ? ?Patient: Andrew Green   DOB: Sep 03, 1965   56 y.o. Male  MRN: [id number] ?Visit Date: 12/05/2021 ? ?Today's healthcare provider: [name], MD  ? ?Chief Complaint  ?Patient presents with  ? Annual Exam  ? ?Subjective  ?  ?Andrew Green is a 56 y.o. male who presents today for a complete physical exam.  ?He reports consuming a general diet. The patient does not participate in regular exercise at present. He generally feels fairly well. He reports sleeping fairly well. He does have additional problems to discuss today. Heal pain / right.  ? ? ?Past Medical History:  ?Diagnosis Date  ? History of chicken pox   ? History of measles   ? History of mumps   ? ?Past Surgical History:  ?Procedure Laterality Date  ? HERNIA REPAIR  1970  ? ?Social History  ? ?Socioeconomic History  ? Marital status: Married  ?  Spouse name: Not on file  ? Number of children: 2  ? Years of education: Not on file  ? Highest education level: Not on file  ?Occupational History  ? Occupation: Chartered certified accountant  ?Tobacco Use  ? Smoking status: Never  ? Smokeless tobacco: Never  ?Substance and Sexual Activity  ? Alcohol use: Not Currently  ?  Alcohol/week: 0.0 standard drinks  ?  Comment: 2-3 times a year  ? Drug use: No  ? Sexual activity: Not on file  ?Other Topics Concern  ? Not on file  ?Social History Narrative  ? Not on file  ? ?Social Determinants of Health  ? ?Financial Resource Strain: Not on file  ?Food Insecurity: Not on file  ?Transportation Needs: Not on file  ?Physical Activity: Not on file  ?Stress: Not on file  ?Social Connections: Not on file  ?Intimate Partner Violence: Not on file  ? ?Family Status  ?Relation Name Status  ? Mother  Alive  ? Father  Deceased  ? Sister  Alive  ? MGM   Deceased  ? PGM  Deceased  ? Daughter  Alive  ? Daughter  Alive  ? Neg Hx  (Not Specified)  ? ?Family History  ?Problem Relation Age of Onset  ? Cervical cancer Mother   ? Colon polyps Father   ? Lung cancer Maternal Grandmother   ? Brain cancer Maternal Grandmother   ? Congestive Heart Failure Paternal Grandmother   ? CAD Neg Hx   ? ?Allergies  ?Allergen Reactions  ? Other   ?  ?Patient Care Team: ?Malva Limes, MD as PCP - General (Family Medicine) ?Deirdre Evener, MD (Dermatology)  ? ?Medications: ?Outpatient Medications Prior to Visit  ?Medication Sig  ? amLODipine (NORVASC) 5 MG tablet TAKE 1 TABLET(5 MG) BY MOUTH DAILY  ? tadalafil (CIALIS) 20 MG tablet Take 0.5-1 tablets (10-20 mg total) by mouth every other day as needed for erectile dysfunction.  ? ?No facility-administered medications prior to visit.  ? ? ?Review of Systems  ?Allergic/Immunologic: Positive for environmental allergies.  ?All other systems reviewed and are negative. ? ? ? Objective  ? ?  ?BP 118/80 (BP Location: Left Arm, Patient Position: Sitting, Cuff Size: Large)   Pulse 95   Temp 98.1 ?F (36.7 ?C) (Oral)   Resp 16  Wt 217 lb 4.8 oz (98.6 kg)   SpO2 96%   BMI 31.18 kg/m?  ? ? ? ?Physical Exam  ? ? ?General Appearance:    Mildly obese male. Alert, cooperative, in no acute distress, appears stated age  ?Head:    Normocephalic, without obvious abnormality, atraumatic  ?Eyes:    PERRL, conjunctiva/corneas clear, EOM's intact, fundi  ?  benign, both eyes       ?Ears:    Normal TM's and external ear canals, both ears  ?Nose:   Nares normal, septum midline, mucosa normal, no drainage ?  or sinus tenderness  ?Throat:   Lips, mucosa, and tongue normal; teeth and gums normal  ?Neck:   Supple, symmetrical, trachea midline, no adenopathy;     ?  thyroid:  No enlargement/tenderness/nodules; no carotid ?  bruit or JVD  ?Back:     Symmetric, no curvature, ROM normal, no CVA tenderness  ?Lungs:     Clear to auscultation bilaterally,  respirations unlabored  ?Chest wall:    No tenderness or deformity  ?Heart:    Normal heart rate. Normal rhythm. No murmurs, rubs, or gallops.  S1 and S2 normal  ?Abdomen:     Soft, non-tender, bowel sounds active all four quadrants,  ?  no masses, no organomegaly  ?Genitalia:    deferred  ?Rectal:    deferred  ?Extremities:   All extremities are intact. No cyanosis or edema  ?Pulses:   2+ and symmetric all extremities  ?Skin:   Skin color, texture, turgor normal, no rashes or lesions  ?Lymph nodes:   Cervical, supraclavicular, and axillary nodes normal  ?Neurologic:   CNII-XII intact. Normal strength, sensation and reflexes    ?  throughout  ?  ? ?Last depression screening scores ? ?  12/05/2021  ?  8:57 AM 06/18/2020  ?  8:58 AM 02/11/2020  ? 10:51 AM  ?PHQ 2/9 Scores  ?PHQ - 2 Score 0 0 0  ?PHQ- 9 Score 0    ? ?Last fall risk screening ? ?  12/05/2021  ?  8:57 AM  ?Fall Risk   ?Falls in the past year? 0  ?Number falls in past yr: 0  ?Injury with Fall? 0  ?Follow up Falls evaluation completed  ? ?Last Audit-C alcohol use screening ? ?  12/05/2021  ?  8:58 AM  ?Alcohol Use Disorder Test (AUDIT)  ?1. How often do you have a drink containing alcohol? 0  ?3. How often do you have six or more drinks on one occasion? 0  ? ?A score of 3 or more in women, and 4 or more in men indicates increased risk for alcohol abuse, EXCEPT if all of the points are from question 1  ? ?No results found for any visits on 12/05/21. ? Assessment & Plan  ?  ?Routine Health Maintenance and Physical Exam ? ?Exercise Activities and Dietary recommendations ? Goals   ?None ?  ? ? ?Immunization History  ?Administered Date(s) Administered  ? Influenza-Unspecified 06/15/2017, 06/18/2018  ? Tdap 12/20/2009, 05/26/2018  ? ? ?Health Maintenance  ?Topic Date Due  ? COVID-19 Vaccine (1) Never done  ? Zoster Vaccines- Shingrix (1 of 2) 03/06/2022 (Originally 09/01/2016)  ? COLONOSCOPY (Pts [id number] Insurance coverage will need to be confirmed)  12/14/2021  ?  INFLUENZA VACCINE  03/21/2022  ? TETANUS/TDAP  05/26/2028  ? Hepatitis C Screening  Completed  ? HIV Screening  Completed  ? HPV VACCINES  Aged Out  ? ? ?Discussed health  benefits of physical activity, and encouraged him to engage in regular exercise appropriate for his age and condition. ? ?1. Annual physical exam ? ?- PSA Total (Reflex To Free) ([facility] only) ?- CBC ?- Comprehensive metabolic panel ?- Hemoglobin A1c ?- Nicotine/cotinine metabolites ?- EKG 12-Lead ?- TSH ? ?2. Plantar fasciitis, right ?He is using OTC NSAIDs and gel cushions. Consider night splint or podiatry referral if not improving.  ? ?3. Essential hypertension ?- CBC ?- Comprehensive metabolic panel ?- Hemoglobin A1c ?- EKG 12-Lead ?- TSH ? ?4. Prostate cancer screening ? ?- PSA Total (Reflex To Free) ([facility] only)  ?  ? ?The entirety of the information documented in the History of Present Illness, Review of Systems and Physical Exam were personally obtained by me. Portions of this information were initially documented by the CMA and reviewed by me for thoroughness and accuracy.   ? ? ?Andrew Merry, MD  ?[facility] ?[phone] (phone) ?[phone] (fax) ? ?[facility] ?

## 2021-12-07 ENCOUNTER — Telehealth: Payer: Self-pay

## 2021-12-07 ENCOUNTER — Other Ambulatory Visit: Payer: Self-pay | Admitting: *Deleted

## 2021-12-07 ENCOUNTER — Other Ambulatory Visit: Payer: Self-pay | Admitting: Family Medicine

## 2021-12-07 DIAGNOSIS — N529 Male erectile dysfunction, unspecified: Secondary | ICD-10-CM

## 2021-12-07 DIAGNOSIS — I10 Essential (primary) hypertension: Secondary | ICD-10-CM

## 2021-12-07 LAB — COMPREHENSIVE METABOLIC PANEL
ALT: 17 IU/L (ref 0–44)
AST: 17 IU/L (ref 0–40)
Albumin/Globulin Ratio: 1.6 (ref 1.2–2.2)
Albumin: 4.4 g/dL (ref 3.8–4.9)
Alkaline Phosphatase: 78 IU/L (ref 44–121)
BUN/Creatinine Ratio: 10 (ref 9–20)
BUN: 11 mg/dL (ref 6–24)
Bilirubin Total: 0.5 mg/dL (ref 0.0–1.2)
CO2: 26 mmol/L (ref 20–29)
Calcium: 9.4 mg/dL (ref 8.7–10.2)
Chloride: 102 mmol/L (ref 96–106)
Creatinine, Ser: 1.12 mg/dL (ref 0.76–1.27)
Globulin, Total: 2.7 g/dL (ref 1.5–4.5)
Glucose: 93 mg/dL (ref 70–99)
Potassium: 3.7 mmol/L (ref 3.5–5.2)
Sodium: 143 mmol/L (ref 134–144)
Total Protein: 7.1 g/dL (ref 6.0–8.5)
eGFR: 78 mL/min/{1.73_m2} (ref >59)

## 2021-12-07 LAB — CBC
Hematocrit: 47.3 % (ref 37.5–51.0)
Hemoglobin: 16.6 g/dL (ref 13.0–17.7)
MCH: 30.5 pg (ref 26.6–33.0)
MCHC: 35.1 g/dL (ref 31.5–35.7)
MCV: 87 fL (ref 79–97)
Platelets: 273 10*3/uL (ref 150–450)
RBC: 5.44 x10E6/uL (ref 4.14–5.80)
RDW: 12.8 % (ref 11.6–15.4)
WBC: 7.6 10*3/uL (ref 3.4–10.8)

## 2021-12-07 LAB — PSA TOTAL (REFLEX TO FREE): Prostate Specific Ag, Serum: 3.1 ng/mL (ref 0.0–4.0)

## 2021-12-07 LAB — NICOTINE/COTININE METABOLITES
Cotinine: <1 ng/mL
Nicotine: <1 ng/mL

## 2021-12-07 LAB — HEMOGLOBIN A1C
Est. average glucose Bld gHb Est-mCnc: 111 mg/dL
Hgb A1c MFr Bld: 5.5 % (ref 4.8–5.6)

## 2021-12-07 LAB — TSH: TSH: 1.13 u[IU]/mL (ref 0.450–4.500)

## 2021-12-07 MED ORDER — TADALAFIL 20 MG PO TABS: 10.0000 mg | ORAL_TABLET | ORAL | 11 refills | Status: DC | PRN

## 2021-12-07 MED ORDER — AMLODIPINE BESYLATE 5 MG PO TABS: ORAL_TABLET | ORAL | 0 refills | Status: DC

## 2021-12-07 NOTE — Telephone Encounter (Signed)
Copied from CRM [id number]. Topic: General - Call Back - No Documentation ?>> Dec 07, 2021  9:57 AM Andrew Green wrote: ?Reason for CRM: pt needs a call back about his labs.  Pt needed a nicotine panel and the results may not be back. Pt needs that sent to his HR person.  He is asking that be emailed to him.  He cannot navigate the [place] very well. ?

## 2021-12-07 NOTE — Telephone Encounter (Signed)
Requested Prescriptions  ?Pending Prescriptions Disp Refills  ?? amLODipine (NORVASC) 5 MG tablet 90 tablet 0  ?  Sig: TAKE 1 TABLET(5 MG) BY MOUTH DAILY  ?  ? Cardiovascular: Calcium Channel Blockers 2 Passed - 12/07/2021 12:13 PM  ?  ?  Passed - Last BP in normal range  ?  BP Readings from Last 1 Encounters:  ?12/05/21 118/80  ?   ?  ?  Passed - Last Heart Rate in normal range  ?  Pulse Readings from Last 1 Encounters:  ?12/05/21 95  ?   ?  ?  Passed - Valid encounter within last 6 months  ?  Recent Outpatient Visits   ?      ? 2 days ago Annual physical exam  ? [facility] Malva Limes, MD  ? 1 year ago Annual physical exam  ? [facility] Malva Limes, MD  ? 1 year ago Essential hypertension  ? [facility] Malva Limes, MD  ? 1 year ago Acute pain of left knee  ? [facility] [place], [place], [place]  ? 2 years ago Suspected COVID-19 virus infection  ? [facility] Chrismon, Jodell Cipro, PA-C  ?  ?  ? ?  ?  ?  ? ?

## 2021-12-07 NOTE — Telephone Encounter (Signed)
Medication Refill - Medication: amLODipine (NORVASC) 5 MG tablet ? ?Has the patient contacted their pharmacy? No. ?(He had a cpe on 4/17 and thought this was to be called in after that. ?90 day ? ?Preferred Pharmacy (with phone number or street name): [id number] - [place], [place] - [address] AT [facility] OF [address] ? ?2. Medication Refill - Medication: tadalafil (CIALIS) 20 MG tablet ? ?Has the patient contacted their pharmacy? No. ?(He had a cpe on 4/17 and thought this was to be called in after that. ?90 day ? ?Preferred Pharmacy (with phone number or street name): [facility] [id number] [place], [place] - [address] ? ?Has the patient been seen for an appointment in the last year OR does the patient have an upcoming appointment? Yes.   ? ? ?

## 2021-12-09 ENCOUNTER — Other Ambulatory Visit: Payer: Self-pay | Admitting: Family Medicine

## 2021-12-09 DIAGNOSIS — N529 Male erectile dysfunction, unspecified: Secondary | ICD-10-CM

## 2021-12-09 NOTE — Telephone Encounter (Signed)
Requested Prescriptions  ?Pending Prescriptions Disp Refills  ?? tadalafil (CIALIS) 20 MG tablet [Pharmacy Med Name: TADALAFIL 20 MG TABLET] 10 tablet 11  ?  Sig: TAKE ONE-HALF TO ONE TABLET BY MOUTH EVERY OTHER DAYS AS NEEDED FOR ERECTILE DYSFUNCTION  ?  ? Urology: Erectile Dysfunction Agents Passed - 12/09/2021  5:44 PM  ?  ?  Passed - AST in normal range and within 360 days  ?  AST  ?Date Value Ref Range Status  ?12/05/2021 17 0 - 40 IU/L Final  ?   ?  ?  Passed - ALT in normal range and within 360 days  ?  ALT  ?Date Value Ref Range Status  ?12/05/2021 17 0 - 44 IU/L Final  ?   ?  ?  Passed - Last BP in normal range  ?  BP Readings from Last 1 Encounters:  ?12/05/21 118/80  ?   ?  ?  Passed - Valid encounter within last 12 months  ?  Recent Outpatient Visits   ?      ? 4 days ago Annual physical exam  ? [facility] Birdie Sons, MD  ? 1 year ago Annual physical exam  ? [facility] Birdie Sons, MD  ? 1 year ago Essential hypertension  ? [facility] Birdie Sons, MD  ? 1 year ago Acute pain of left knee  ? [place], [place]  ? 2 years ago Suspected COVID-19 virus infection  ? [place], PA-C  ?  ?  ? ?  ?  ?  ? ?

## 2022-01-01 ENCOUNTER — Other Ambulatory Visit: Payer: Self-pay | Admitting: Family Medicine

## 2022-01-01 DIAGNOSIS — I10 Essential (primary) hypertension: Secondary | ICD-10-CM

## 2022-04-05 ENCOUNTER — Other Ambulatory Visit: Payer: Self-pay | Admitting: Family Medicine

## 2022-04-05 DIAGNOSIS — I10 Essential (primary) hypertension: Secondary | ICD-10-CM

## 2022-08-04 DIAGNOSIS — B9689 Other specified bacterial agents as the cause of diseases classified elsewhere: Secondary | ICD-10-CM | POA: Diagnosis not present

## 2022-08-04 DIAGNOSIS — J019 Acute sinusitis, unspecified: Secondary | ICD-10-CM | POA: Diagnosis not present

## 2022-08-04 DIAGNOSIS — J209 Acute bronchitis, unspecified: Secondary | ICD-10-CM | POA: Diagnosis not present

## 2023-03-05 ENCOUNTER — Other Ambulatory Visit: Payer: Self-pay | Admitting: Family Medicine

## 2023-03-05 DIAGNOSIS — I10 Essential (primary) hypertension: Secondary | ICD-10-CM

## 2023-03-05 MED ORDER — AMLODIPINE BESYLATE 5 MG PO TABS: ORAL_TABLET | ORAL | 0 refills | Status: DC

## 2023-03-05 NOTE — Telephone Encounter (Signed)
Requested Prescriptions  Pending Prescriptions Disp Refills   amLODipine (NORVASC) 5 MG tablet 90 tablet 0    Sig: TAKE 1 TABLET(5 MG) BY MOUTH DAILY     Cardiovascular: Calcium Channel Blockers 2 Failed - 03/05/2023 10:22 AM      Failed - Valid encounter within last 6 months    Recent Outpatient Visits           1 year ago Annual physical exam   [facility] Malva Limes, MD   2 years ago Annual physical exam   [facility] Malva Limes, MD   3 years ago Essential hypertension   [place] Malva Limes, MD   3 years ago Acute pain of left knee   [facility] [place], [place], [place]   3 years ago Suspected COVID-19 virus infection   [place] Chrismon, Jodell Cipro, [place]       Future Appointments             In 1 week Elie Goody, MD [facility]   In 4 weeks Sherrie Mustache, Demetrios Isaacs, MD [facility]            Passed - Last BP in normal range    BP Readings from Last 1 Encounters:  12/05/21 118/80         Passed - Last Heart Rate in normal range    Pulse Readings from Last 1 Encounters:  12/05/21 95

## 2023-03-05 NOTE — Telephone Encounter (Signed)
Medication Refill - Medication: amLODipine (NORVASC) 5 MG tablet  - not out, has about a month left  Has the patient contacted their pharmacy? No. Last refill patient had, pharmacy told patient to contact PCP.  Preferred Pharmacy (with phone number or street name):  [id number] - [place], [place] - [address] AT [facility] OF [address] [facility] Phone: [phone]  Fax: [phone]      Has the patient been seen for an appointment in the last year OR does the patient have an upcoming appointment? Yes.  Physical 8.13.2024 with PCP

## 2023-03-14 ENCOUNTER — Ambulatory Visit: Payer: BC Managed Care – PPO | Admitting: Dermatology

## 2023-03-14 ENCOUNTER — Encounter: Payer: Self-pay | Admitting: Dermatology

## 2023-03-14 VITALS — BP 146/92 | HR 79

## 2023-03-14 DIAGNOSIS — D492 Neoplasm of unspecified behavior of bone, soft tissue, and skin: Secondary | ICD-10-CM

## 2023-03-14 DIAGNOSIS — L720 Epidermal cyst: Secondary | ICD-10-CM

## 2023-03-14 DIAGNOSIS — C4441 Basal cell carcinoma of skin of scalp and neck: Secondary | ICD-10-CM

## 2023-03-14 DIAGNOSIS — C4491 Basal cell carcinoma of skin, unspecified: Secondary | ICD-10-CM

## 2023-03-14 HISTORY — DX: Basal cell carcinoma of skin, unspecified: C44.91

## 2023-03-14 MED ORDER — DOXYCYCLINE MONOHYDRATE 100 MG PO CAPS: 100.0000 mg | ORAL_CAPSULE | Freq: Two times a day (BID) | ORAL | 0 refills | Status: AC

## 2023-03-14 NOTE — Progress Notes (Signed)
New Patient Visit   Subjective  Andrew Green is a 57 y.o. male who presents for the following: cyst on back. Has been treated in this office before with I&D ~10 years ago. Has recurred over the past few months. Has drained recently. Tender if pressure on area.    The following portions of the chart were reviewed this encounter and updated as appropriate: medications, allergies, medical history  Review of Systems:  No other skin or systemic complaints except as noted in HPI or Assessment and Plan.  Objective  Well appearing patient in no apparent distress; mood and affect are within normal limits.  A focused examination was performed of the following areas: Back  Relevant exam findings are noted in the Assessment and Plan.  Right Upper Back 35 x 22 mm subcutaneous nodule with overlying erythema and crusted papules       Right Posterior Neck 7 mm pink pearly papule         Assessment & Plan    Epidermal inclusion cyst Right Upper Back  Benign-appearing. Exam most consistent with an epidermal inclusion cyst. Discussed that a cyst is a benign growth that can grow over time and sometimes get irritated or inflamed. Recommend observation if it is not bothersome. Discussed option of surgical excision to remove it if it is growing, symptomatic, or other changes noted. Please call for new or changing lesions so they can be evaluated.   Discussed surgery vs I&D with antibiotic therapy. Patient prefers I&D today with antibiotic therapy.   Start Doxycycline 100 mg twice daily with food for 10 days.   Doxycycline should be taken with food to prevent nausea. Do not lay down for 30 minutes after taking. Be cautious with sun exposure and use good sun protection while on this medication. Pregnant women should not take this medication.     doxycycline (MONODOX) 100 MG capsule - Right Upper Back Take 1 capsule (100 mg total) by mouth 2 (two) times daily for 10 days. Take with  food  Incision and Drainage - Right Upper Back Location: right upper back  Informed Consent: Discussed risks (permanent scarring, light or dark discoloration, infection, pain, bleeding, bruising, redness, damage to adjacent structures, and recurrence of the lesion) and benefits of the procedure, as well as the alternatives.  Informed consent was obtained.  Preparation: The area was prepped with alcohol.  Anesthesia: Lidocaine 1% with epinephrine  Procedure Details: An incision was made overlying the lesion. The lesion drained white, chalky cyst material and blood.  A large amount of fluid was drained.    Antibiotic ointment and a sterile pressure dressing were applied. The patient tolerated procedure well.  Total number of lesions drained: 1  Plan: The patient was instructed on post-op care. Recommend OTC analgesia as needed for pain.   Neoplasm of skin Right Posterior Neck  Skin / nail biopsy Type of biopsy: tangential   Informed consent: discussed and consent obtained   Anesthesia: the lesion was anesthetized in a standard fashion   Anesthesia comment:  Area prepped with alcohol Anesthetic:  1% lidocaine w/ epinephrine 1-100,000 buffered w/ 8.4% NaHCO3 Instrument used: flexible razor blade   Hemostasis achieved with: pressure, aluminum chloride and electrodesiccation   Outcome: patient tolerated procedure well   Post-procedure details: wound care instructions given   Post-procedure details comment:  Ointment and small bandage applied  Specimen 1 - Surgical pathology Differential Diagnosis: R/O BCC  Check Margins: No  Plan Mohs pending pathology results. Discussed Mohs  with patient at time of appointment.     Return for follow up pending pathology results.  I, Lawson Radar, CMA, am acting as scribe for Elie Goody, MD.   Documentation: I have reviewed the above documentation for accuracy and completeness, and I agree with the above.  Elie Goody,  MD

## 2023-03-14 NOTE — Patient Instructions (Addendum)
Start Doxycycline 100 mg twice daily with food for 10 days.   Doxycycline should be taken with food to prevent nausea. Do not lay down for 30 minutes after taking. Be cautious with sun exposure and use good sun protection while on this medication. Pregnant women should not take this medication.   Wound Care Instructions  Cleanse wound gently with soap and water once a day then pat dry with clean gauze. Apply a thin coat of Petrolatum (petroleum jelly, "Vaseline") over the wound (unless you have an allergy to this). We recommend that you use a new, sterile tube of Vaseline. Do not pick or remove scabs. Do not remove the yellow or white "healing tissue" from the base of the wound.  Cover the wound with fresh, clean, nonstick gauze and secure with paper tape. You may use Band-Aids in place of gauze and tape if the wound is small enough, but would recommend trimming much of the tape off as there is often too much. Sometimes Band-Aids can irritate the skin.  You should call the office for your biopsy report after 1 week if you have not already been contacted.  If you experience any problems, such as abnormal amounts of bleeding, swelling, significant bruising, significant pain, or evidence of infection, please call the office immediately.  FOR ADULT SURGERY PATIENTS: If you need something for pain relief you may take 1 extra strength Tylenol (acetaminophen) AND 2 Ibuprofen (200mg  each) together every 4 hours as needed for pain. (do not take these if you are allergic to them or if you have a reason you should not take them.) Typically, you may only need pain medication for 1 to 3 days.     Due to recent changes in healthcare laws, you may see results of your pathology and/or laboratory studies on MyChart before the doctors have had a chance to review them. We understand that in some cases there may be results that are confusing or concerning to you. Please understand that not all results are received at  the same time and often the doctors may need to interpret multiple results in order to provide you with the best plan of care or course of treatment. Therefore, we ask that you please give [place] 2 business days to thoroughly review all your results before contacting the office for clarification. Should we see a critical lab result, you will be contacted sooner.   If You Need Anything After Your Visit  If you have any questions or concerns for your doctor, please call our main line at [phone] and press option 4 to reach your doctor's medical assistant. If no one answers, please leave a voicemail as directed and we will return your call as soon as possible. Messages left after 4 pm will be answered the following business day.   You may also send [place] a message via MyChart. We typically respond to MyChart messages within 1-2 business days.  For prescription refills, please ask your pharmacy to contact our office. Our fax number is [phone].  If you have an urgent issue when the clinic is closed that cannot wait until the next business day, you can page your doctor at the number below.    Please note that while we do our best to be available for urgent issues outside of office hours, we are not available 24/7.   If you have an urgent issue and are unable to reach [place], you may choose to seek medical care at your doctor's office, retail clinic,  urgent care center, or emergency room.  If you have a medical emergency, please immediately call 911 or go to the emergency department.  Pager Numbers  - Dr. Gwen Pounds: [phone]  - Dr. Neale Burly: [phone]  - Dr. Roseanne Reno: [phone]  In the event of inclement weather, please call our main line at [phone] for an update on the status of any delays or closures.  Dermatology Medication Tips: Please keep the boxes that topical medications come in in order to help keep track of the instructions about where and how to use these. Pharmacies  typically print the medication instructions only on the boxes and not directly on the medication tubes.   If your medication is too expensive, please contact our office at [phone] option 4 or send [place] a message through MyChart.   We are unable to tell what your co-pay for medications will be in advance as this is different depending on your insurance coverage. However, we may be able to find a substitute medication at lower cost or fill out paperwork to get insurance to cover a needed medication.   If a prior authorization is required to get your medication covered by your insurance company, please allow [place] 1-2 business days to complete this process.  Drug prices often vary depending on where the prescription is filled and some pharmacies may offer cheaper prices.  The website www.goodrx.com contains coupons for medications through different pharmacies. The prices here do not account for what the cost may be with help from insurance (it may be cheaper with your insurance), but the website can give you the price if you did not use any insurance.  - You can print the associated coupon and take it with your prescription to the pharmacy.  - You may also stop by our office during regular business hours and pick up a GoodRx coupon card.  - If you need your prescription sent electronically to a different pharmacy, notify our office through [facility] or by phone at [phone] option 4.     Si Usted Necesita Algo Despus de Su Visita  Tambin puede enviarnos un mensaje a travs de Clinical cytogeneticist. Por lo general respondemos a los mensajes de MyChart en el transcurso de 1 a 2 das hbiles.  Para renovar recetas, por favor pida a su farmacia que se ponga en contacto con nuestra oficina. Annie Sable de fax es [place] [phone].  Si tiene un asunto urgente cuando la clnica est cerrada y que no puede esperar hasta el siguiente da hbil, puede llamar/localizar a su doctor(a) al nmero que  aparece a continuacin.   Por favor, tenga en cuenta que aunque hacemos todo lo posible para estar disponibles para asuntos urgentes fuera del horario de [place], no estamos disponibles las 24 horas del da, los 7 [address]  [address] de la [place].   Si tiene un problema urgente y no puede comunicarse con nosotros, puede optar por buscar atencin mdica  en el consultorio de su doctor(a), en una clnica privada, en un centro de atencin urgente o en una sala de emergencias.  Si tiene Engineer, drilling, por favor llame inmediatamente al 911 o vaya a la sala de emergencias.  Nmeros de bper  - Dr. Gwen Pounds: [phone]  - Dra. Moye: [phone]  - Dra. Roseanne Reno: [phone]  En caso de inclemencias del [place], por favor llame a Lacy Duverney principal al [phone] para una actualizacin sobre el [place] de cualquier retraso o cierre.  Consejos para la medicacin en dermatologa: Por favor, guarde las cajas  en las que vienen los medicamentos de uso tpico para ayudarle a seguir las instrucciones sobre dnde y cmo usarlos. Las farmacias generalmente imprimen las instrucciones del medicamento slo en las cajas y no directamente en los tubos del [place].   Si su medicamento es muy caro, por favor, pngase en contacto con Rolm Gala llamando al [phone] y presione la opcin 4 o envenos un mensaje a travs de Clinical cytogeneticist.   No podemos decirle cul ser su copago por los medicamentos por adelantado ya que esto es diferente dependiendo de la cobertura de su seguro. Sin embargo, es posible que podamos encontrar un medicamento sustituto a Audiological scientist un formulario para que el seguro cubra el medicamento que se considera necesario.   Si se requiere una autorizacin previa para que su compaa de seguros [place] su medicamento, por favor permtanos de 1 a 2 das hbiles para completar [address]  Los precios de los medicamentos varan con frecuencia dependiendo del Environmental consultant de dnde se surte  la receta y alguna farmacias pueden ofrecer precios ms baratos.  El sitio web www.goodrx.com tiene cupones para medicamentos de Health and safety inspector. Los precios aqu no tienen en cuenta lo que podra costar con la ayuda del seguro (puede ser ms barato con su seguro), pero el sitio web puede darle el precio si no utiliz Tourist information centre manager.  - Puede imprimir el cupn correspondiente y llevarlo con su receta a la farmacia.  - Tambin puede pasar por nuestra oficina durante el horario de atencin regular y Education officer, museum una tarjeta de cupones de GoodRx.  - Si necesita que su receta se enve electrnicamente a una farmacia diferente, informe a nuestra oficina a travs de MyChart de [place] o por telfono llamando al [phone] y presione la opcin 4.

## 2023-03-21 LAB — SURGICAL PATHOLOGY

## 2023-03-22 ENCOUNTER — Telehealth: Payer: Self-pay

## 2023-03-22 DIAGNOSIS — C4441 Basal cell carcinoma of skin of scalp and neck: Secondary | ICD-10-CM

## 2023-03-22 NOTE — Telephone Encounter (Signed)
-----   Message from [place] sent at 03/21/2023  7:15 PM EDT ----- Diagnosis: Skin , right posterior neck BASAL CELL CARCINOMA, NODULAR PATTERN  Please call with diagnosis and determine where the patient would like to have Mohs surgery. If the patient asks for my recommendation for Mohs surgeon, please refer to [facility] Mohs Surgery (Dr Coralie Carpen and Dr Caprice Beaver)  Explanation: your biopsy shows a basal cell skin cancer in the second layer of the skin. This is the most common kind of skin cancer and is caused by damage from sun exposure. Basal cell skin cancers almost never spread beyond the skin, so they are not dangerous to your overall health. However, they will continue to grow, can bleed, cause nonhealing wounds, and disrupt nearby structures unless fully treated.  Treatment: Given the location and type of skin cancer, I recommend Mohs surgery. Mohs surgery involves cutting out the skin cancer and then checking under the microscope to ensure the whole skin cancer was removed. If any skin cancer remains, the surgeon will cut out more until it is fully removed. The cure rate is about 98-99%. Once the Mohs surgeon confirms the skin cancer is out, they will discuss the options to repair or heal the area. You must take it easy for about two weeks after surgery (no lifting over 10-15 lbs, avoid activity to get your heart rate and blood pressure up). It is done at another office outside of [place] ([place], [place], or [place]).

## 2023-03-22 NOTE — Telephone Encounter (Signed)
Discussed pathology results. Discussed Mohs surgery. Referral sent for Mohs at [facility] with Dr. Lorn Junes. Patient voiced understanding.

## 2023-04-03 ENCOUNTER — Encounter: Payer: Self-pay | Admitting: Family Medicine

## 2023-04-03 ENCOUNTER — Telehealth: Payer: Self-pay

## 2023-04-03 ENCOUNTER — Other Ambulatory Visit: Payer: Self-pay | Admitting: *Deleted

## 2023-04-03 ENCOUNTER — Telehealth: Payer: Self-pay | Admitting: *Deleted

## 2023-04-03 ENCOUNTER — Ambulatory Visit (INDEPENDENT_AMBULATORY_CARE_PROVIDER_SITE_OTHER): Payer: BC Managed Care – PPO | Admitting: Family Medicine

## 2023-04-03 VITALS — BP 140/97 | HR 87 | Ht 70.0 in

## 2023-04-03 DIAGNOSIS — I10 Essential (primary) hypertension: Secondary | ICD-10-CM | POA: Diagnosis not present

## 2023-04-03 DIAGNOSIS — Z86010 Personal history of colonic polyps: Secondary | ICD-10-CM

## 2023-04-03 DIAGNOSIS — N529 Male erectile dysfunction, unspecified: Secondary | ICD-10-CM

## 2023-04-03 DIAGNOSIS — Z0000 Encounter for general adult medical examination without abnormal findings: Secondary | ICD-10-CM

## 2023-04-03 DIAGNOSIS — Z125 Encounter for screening for malignant neoplasm of prostate: Secondary | ICD-10-CM

## 2023-04-03 DIAGNOSIS — Z1211 Encounter for screening for malignant neoplasm of colon: Secondary | ICD-10-CM

## 2023-04-03 DIAGNOSIS — R109 Unspecified abdominal pain: Secondary | ICD-10-CM

## 2023-04-03 LAB — POCT URINALYSIS DIPSTICK
Bilirubin, UA: NEGATIVE
Blood, UA: NEGATIVE
Glucose, UA: NEGATIVE
Ketones, UA: NEGATIVE
Leukocytes, UA: NEGATIVE
Nitrite, UA: NEGATIVE
Protein, UA: POSITIVE — AB
Spec Grav, UA: 1.01 (ref 1.010–1.025)
Urobilinogen, UA: 0.2 E.U./dL
pH, UA: 7.5 (ref 5.0–8.0)

## 2023-04-03 MED ORDER — AMLODIPINE BESYLATE 10 MG PO TABS
10.0000 mg | ORAL_TABLET | Freq: Every day | ORAL | 1 refills | Status: DC
Start: 2023-04-03 — End: 2023-07-18

## 2023-04-03 MED ORDER — NA SULFATE-K SULFATE-MG SULF 17.5-3.13-1.6 GM/177ML PO SOLN: 1.0000 | Freq: Once | ORAL | 0 refills | Status: AC

## 2023-04-03 MED ORDER — TADALAFIL 20 MG PO TABS
ORAL_TABLET | ORAL | 3 refills | Status: AC
Start: 2023-04-03 — End: ?

## 2023-04-03 NOTE — Patient Instructions (Signed)
.   Please review the attached list of medications and notify my office if there are any errors.   . Please bring all of your medications to every appointment so we can make sure that our medication list is the same as yours.   . It is recommended to engage in 150 minutes of moderate exercise every week.

## 2023-04-03 NOTE — Telephone Encounter (Signed)
Patient is calling to schedule his colonoscopy. Please call patient to schedule  

## 2023-04-03 NOTE — Telephone Encounter (Signed)
Colonoscopy schedule 05/25/2023 with Dr Tobi Bastos

## 2023-04-03 NOTE — Telephone Encounter (Signed)
Gastroenterology Pre-Procedure Review  Request Date: 05/25/2023 Requesting Physician: Dr. Servando Snare  PATIENT REVIEW QUESTIONS: The patient responded to the following health history questions as indicated:    1. Are you having any GI issues? no 2. Do you have a personal history of Polyps? yes (4/46/2016) 3. Do you have a family history of Colon Cancer or Polyps? no 4. Diabetes Mellitus? no 5. Joint replacements in the past 12 months?no 6. Major health problems in the past 3 months?no 7. Any artificial heart valves, MVP, or defibrillator?no    MEDICATIONS & ALLERGIES:    Patient reports the following regarding taking any anticoagulation/antiplatelet therapy:   Plavix, Coumadin, Eliquis, Xarelto, Lovenox, Pradaxa, Brilinta, or Effient? no Aspirin? no  Patient confirms/reports the following medications:  Current Outpatient Medications  Medication Sig Dispense Refill   amLODipine (NORVASC) 10 MG tablet Take 1 tablet (10 mg total) by mouth daily. TAKE 1 TABLET(5 MG) BY MOUTH DAILY 90 tablet 1   tadalafil (CIALIS) 20 MG tablet TAKE ONE-HALF TO ONE TABLET BY MOUTH EVERY OTHER DAYS AS NEEDED FOR ERECTILE DYSFUNCTION 30 tablet 3   No current facility-administered medications for this visit.    Patient confirms/reports the following allergies:  Allergies  Allergen Reactions   Other     No orders of the defined types were placed in this encounter.   AUTHORIZATION INFORMATION Primary Insurance: 1D#: Group #:  Secondary Insurance: 1D#: Group #:  SCHEDULE INFORMATION: Date: 05/25/2023 Time: Location:  [facility]

## 2023-04-03 NOTE — Progress Notes (Signed)
Complete physical exam   Patient: Andrew Green   DOB: 07-16-1966   57 y.o. Male  MRN: [id number] Visit Date: 04/03/2023  Today's healthcare provider: [name], MD   Chief Complaint  Patient presents with   Annual Exam   Subjective    Discussed the use of AI scribe software for clinical note transcription with the patient, who gave verbal consent to proceed.  History of Present Illness   The patient presents for annual physical. Reports a recent episode of bloating and gassiness over the past weekend, which was described as the worst he's experienced. He also reported a new onset of right heel pain, suspected to be plantar fasciitis, for which he has been using a boot at night for about a week with some relief.  The patient also reported a recent episode of dysuria over the past weekend, which has since resolved. He has been experiencing a twitch in his left eye, which he plans to discuss with his optometrist.  The patient has a history of basal cell carcinoma on the neck, for which he is awaiting a surgical procedure. He also has a history of hypertension, currently managed with amlodipine, but he has been noticing blood pressure readings consistently in the 140s.  The patient also mentioned a significant weight loss from 215 to 178 pounds when he switched jobs and had more physical activity, but he regained the weight after returning to his previous job. He has not made any changes to his diet.  The patient is due for a colonoscopy due to finding of adenomatous polyp on last colonoscopy by Dr. Markham Jordan in 2018, but he had a poor tolerance to the bowel prep at that time.   The patient does not exercise regularly outside of his job, which involves some walking. He has not noticed any side effects from his current medication, amlodipine.       Past Medical History:  Diagnosis Date   Basal cell carcinoma 03/14/2023   Right posterior neck. Nodular. Mohs pending.   History  of chicken pox    History of measles    History of mumps    Past Surgical History:  Procedure Laterality Date   HERNIA REPAIR  1970   Social History   Socioeconomic History   Marital status: Married    Spouse name: Not on file   Number of children: 2   Years of education: Not on file   Highest education level: Not on file  Occupational History   Occupation: Chartered certified accountant  Tobacco Use   Smoking status: Never   Smokeless tobacco: Never  Substance and Sexual Activity   Alcohol use: Not Currently    Alcohol/week: 0.0 standard drinks of alcohol    Comment: 2-3 times a year   Drug use: No   Sexual activity: Not on file  Other Topics Concern   Not on file  Social History Narrative   Not on file   Social Determinants of Health   Financial Resource Strain: Not on file  Food Insecurity: Not on file  Transportation Needs: Not on file  Physical Activity: Not on file  Stress: Not on file  Social Connections: Not on file  Intimate Partner Violence: Not on file   Family Status  Relation Name Status   Mother  Alive   Father  Deceased   Sister  Alive   MGM  Deceased   [facility]  Deceased   Daughter  Alive   Daughter  Alive  Neg Hx  (Not Specified)  No partnership data on file   Family History  Problem Relation Age of Onset   Cervical cancer Mother    Colon polyps Father    Lung cancer Maternal Grandmother    Brain cancer Maternal Grandmother    Congestive Heart Failure Paternal Grandmother    CAD Neg Hx    Allergies  Allergen Reactions   Other     Patient Care Team: Malva Limes, MD as PCP - General (Family Medicine) Deirdre Evener, MD (Dermatology) Elie Goody, MD as Referring Physician (Dermatology)   Medications: Outpatient Medications Prior to Visit  Medication Sig   [DISCONTINUED] amLODipine (NORVASC) 5 MG tablet TAKE 1 TABLET(5 MG) BY MOUTH DAILY   [DISCONTINUED] tadalafil (CIALIS) 20 MG tablet TAKE ONE-HALF TO ONE TABLET BY MOUTH EVERY OTHER  DAYS AS NEEDED FOR ERECTILE DYSFUNCTION   No facility-administered medications prior to visit.    Review of Systems    Objective    BP (!) 140/97 (BP Location: Left Arm, Patient Position: Sitting, Cuff Size: Large)   Pulse 87   Ht 5\' 10"  (1.778 m)   BMI 31.18 kg/m    Physical Exam   General Appearance:    Mildly obese male. Alert, cooperative, in no acute distress, appears stated age  Head:    Normocephalic, without obvious abnormality, atraumatic  Eyes:    PERRL, conjunctiva/corneas clear, EOM's intact, fundi    benign, both eyes       Ears:    Normal TM's and external ear canals, both ears  Nose:   Nares normal, septum midline, mucosa normal, no drainage   or sinus tenderness  Throat:   Lips, mucosa, and tongue normal; teeth and gums normal  Neck:   Supple, symmetrical, trachea midline, no adenopathy;       thyroid:  No enlargement/tenderness/nodules; no carotid   bruit or JVD  Back:     Symmetric, no curvature, ROM normal, no CVA tenderness  Lungs:     Clear to auscultation bilaterally, respirations unlabored  Chest wall:    No tenderness or deformity  Heart:    Normal heart rate. Normal rhythm. No murmurs, rubs, or gallops.  S1 and S2 normal  Abdomen:     Soft, non-tender, bowel sounds active all four quadrants,    no masses, no organomegaly  Genitalia:    deferred  Rectal:    deferred  Extremities:   All extremities are intact. No cyanosis or edema  Pulses:   2+ and symmetric all extremities  Skin:   Several pigmented lesions c/with Sks on back.   Lymph nodes:   Cervical, supraclavicular, and axillary nodes normal  Neurologic:   CNII-XII intact. Normal strength, sensation and reflexes      throughout     Last depression screening scores    04/03/2023    8:29 AM 12/05/2021    8:57 AM 06/18/2020    8:58 AM  PHQ 2/9 Scores  PHQ - 2 Score 0 0 0  PHQ- 9 Score  0    Last fall risk screening    04/03/2023    8:29 AM  Fall Risk   Falls in the past year? 0   Injury with Fall? 0  Risk for fall due to : No Fall Risks  Follow up Falls evaluation completed   Last Audit-C alcohol use screening    12/05/2021    8:58 AM  Alcohol Use Disorder Test (AUDIT)  1. How often do you have  a drink containing alcohol? 0  3. How often do you have six or more drinks on one occasion? 0   A score of 3 or more in women, and 4 or more in men indicates increased risk for alcohol abuse, EXCEPT if all of the points are from question 1   Results for orders placed or performed in visit on 04/03/23  POCT urinalysis dipstick  Result Value Ref Range   Color, UA yellow    Clarity, UA dark    Glucose, UA Negative Negative   Bilirubin, UA Negative    Ketones, UA Negative    Spec Grav, UA 1.010 1.010 - 1.025   Blood, UA Negative    pH, UA 7.5 5.0 - 8.0   Protein, UA Positive (A) Negative   Urobilinogen, UA 0.2 0.2 or 1.0 E.U./dL   Nitrite, UA Negative    Leukocytes, UA Negative Negative   Appearance     Odor      Assessment & Plan    Routine Health Maintenance and Physical Exam  Exercise Activities and Dietary recommendations  Goals   None     Immunization History  Administered Date(s) Administered   Influenza-Unspecified 06/15/2017, 06/18/2018   Tdap 12/20/2009, 05/26/2018    Health Maintenance  Topic Date Due   COVID-19 Vaccine (1) Never done   Zoster Vaccines- Shingrix (1 of 2) Never done   Colonoscopy  12/14/2021   INFLUENZA VACCINE  03/22/2023   DTaP/Tdap/Td (3 - Td or Tdap) 05/26/2028   Hepatitis C Screening  Completed   HIV Screening  Completed   HPV VACCINES  Aged Out    Discussed health benefits of physical activity, and encouraged him to engage in regular exercise appropriate for his age and condition.  Assessment and Plan    Hypertension Blood pressure consistently in the 140s. No side effects reported from current medication, Amlodipine 5mg . -Increase Amlodipine to 10mg  daily. Monitor for side effects such as swelling and  constipation.  Gastrointestinal Distress Recent episode of bloating and gas, more severe than usual. No current recurrence. -Normal urinalysis today.  -Observe for any recurrence or pattern related to diet.  Plantar Fasciitis Self-reported plantar fasciitis in the right heel. Using a boot at night for about a week with reported improvement. -Continue use of boot at night. Monitor for continued improvement.  Basal Cell Carcinoma Recent diagnosis on the neck. Awaiting surgical procedure at [facility]. -Follow up post-surgery for wound care and monitoring.  Colonoscopy Previous poor tolerance to bowel prep. Last colonoscopy six years ago. -Refer to GI for colonoscopy with a different bowel prep due to previous intolerance.  Hearing Loss Self-reported worsening hearing loss in the left ear. Possible occupational exposure. -Recommend follow-up with Dr. Clydene Pugh for hearing evaluation.  Complete physical -Declined Shingles vaccine. Advised that it is available at the pharmacy if he changes his mind. -Complete blood work today as part of physical examination.     Refill Cialis for ED       [name], MD  [facility] [phone] (phone) [phone] (fax)  [facility]

## 2023-04-06 ENCOUNTER — Encounter: Payer: BC Managed Care – PPO | Admitting: Family Medicine

## 2023-05-16 DIAGNOSIS — L578 Other skin changes due to chronic exposure to nonionizing radiation: Secondary | ICD-10-CM | POA: Diagnosis not present

## 2023-05-16 DIAGNOSIS — L988 Other specified disorders of the skin and subcutaneous tissue: Secondary | ICD-10-CM | POA: Diagnosis not present

## 2023-05-16 DIAGNOSIS — C4441 Basal cell carcinoma of skin of scalp and neck: Secondary | ICD-10-CM | POA: Diagnosis not present

## 2023-05-16 DIAGNOSIS — L814 Other melanin hyperpigmentation: Secondary | ICD-10-CM | POA: Diagnosis not present

## 2023-05-16 HISTORY — PX: MOHS SURGERY: SHX181

## 2023-05-24 ENCOUNTER — Encounter: Payer: Self-pay | Admitting: Gastroenterology

## 2023-05-25 ENCOUNTER — Encounter: Payer: Self-pay | Admitting: Gastroenterology

## 2023-05-25 ENCOUNTER — Ambulatory Visit
Admission: RE | Admit: 2023-05-25 | Discharge: 2023-05-25 | Disposition: A | Discharge status: Home / Self Care | Payer: BC Managed Care – PPO | Attending: Gastroenterology | Admitting: Gastroenterology

## 2023-05-25 ENCOUNTER — Other Ambulatory Visit: Payer: Self-pay

## 2023-05-25 ENCOUNTER — Ambulatory Visit: Payer: BC Managed Care – PPO | Admitting: Anesthesiology

## 2023-05-25 ENCOUNTER — Encounter
Admission: RE | Disposition: A | Discharge status: Home / Self Care | Payer: Self-pay | Source: Home / Self Care | Attending: Gastroenterology

## 2023-05-25 DIAGNOSIS — I10 Essential (primary) hypertension: Secondary | ICD-10-CM | POA: Insufficient documentation

## 2023-05-25 DIAGNOSIS — D126 Benign neoplasm of colon, unspecified: Secondary | ICD-10-CM

## 2023-05-25 DIAGNOSIS — Z1211 Encounter for screening for malignant neoplasm of colon: Secondary | ICD-10-CM | POA: Diagnosis not present

## 2023-05-25 DIAGNOSIS — K5730 Diverticulosis of large intestine without perforation or abscess without bleeding: Secondary | ICD-10-CM | POA: Diagnosis not present

## 2023-05-25 DIAGNOSIS — D124 Benign neoplasm of descending colon: Secondary | ICD-10-CM | POA: Insufficient documentation

## 2023-05-25 DIAGNOSIS — D120 Benign neoplasm of cecum: Secondary | ICD-10-CM | POA: Diagnosis not present

## 2023-05-25 DIAGNOSIS — Z860101 Personal history of adenomatous and serrated colon polyps: Secondary | ICD-10-CM | POA: Insufficient documentation

## 2023-05-25 DIAGNOSIS — K635 Polyp of colon: Secondary | ICD-10-CM | POA: Insufficient documentation

## 2023-05-25 DIAGNOSIS — K219 Gastro-esophageal reflux disease without esophagitis: Secondary | ICD-10-CM | POA: Insufficient documentation

## 2023-05-25 DIAGNOSIS — Z83719 Family history of colon polyps, unspecified: Secondary | ICD-10-CM | POA: Insufficient documentation

## 2023-05-25 HISTORY — PX: POLYPECTOMY: SHX5525

## 2023-05-25 HISTORY — PX: COLONOSCOPY WITH PROPOFOL: SHX5780

## 2023-05-25 HISTORY — DX: Essential (primary) hypertension: I10

## 2023-05-25 SURGERY — COLONOSCOPY WITH PROPOFOL
Anesthesia: General

## 2023-05-25 MED ORDER — LIDOCAINE HCL (CARDIAC) PF 100 MG/5ML IV SOSY
PREFILLED_SYRINGE | INTRAVENOUS | Status: DC | PRN
  Administered 2023-05-25: 100 mg via INTRAVENOUS

## 2023-05-25 MED ORDER — PROPOFOL 10 MG/ML IV BOLUS
INTRAVENOUS | Status: DC | PRN
  Administered 2023-05-25 (×2): 50 mg via INTRAVENOUS

## 2023-05-25 MED ORDER — PROPOFOL 10 MG/ML IV BOLUS
INTRAVENOUS | Status: AC
  Filled 2023-05-25: qty 20

## 2023-05-25 MED ORDER — PROPOFOL 500 MG/50ML IV EMUL
INTRAVENOUS | Status: DC | PRN
  Administered 2023-05-25: 100 ug/kg/min via INTRAVENOUS

## 2023-05-25 MED ORDER — SODIUM CHLORIDE 0.9 % IV SOLN: INTRAVENOUS | Status: DC

## 2023-05-25 MED ORDER — DEXMEDETOMIDINE HCL IN NACL 80 MCG/20ML IV SOLN
INTRAVENOUS | Status: DC | PRN
Start: 2023-05-25 — End: 2023-05-25
  Administered 2023-05-25: 8 ug via INTRAVENOUS

## 2023-05-25 NOTE — Anesthesia Preprocedure Evaluation (Signed)
Anesthesia Evaluation  Patient identified by MRN, date of birth, ID band Patient awake    Reviewed: Allergy & Precautions, NPO status , Patient's Chart, lab work & pertinent test results  Airway Mallampati: II  TM Distance: >3 FB Neck ROM: full    Dental  (+) Teeth Intact   Pulmonary neg pulmonary ROS   Pulmonary exam normal        Cardiovascular Exercise Tolerance: Good hypertension, Pt. on medications negative cardio ROS Normal cardiovascular exam Rhythm:Regular Rate:Normal     Neuro/Psych negative neurological ROS  negative psych ROS   GI/Hepatic negative GI ROS, Neg liver ROS,GERD  Medicated,,  Endo/Other  negative endocrine ROS    Renal/GU negative Renal ROS  negative genitourinary   Musculoskeletal negative musculoskeletal ROS (+)    Abdominal  (+) + obese  Peds negative pediatric ROS (+)  Hematology negative hematology ROS (+)   Anesthesia Other Findings Past Medical History: 03/14/2023: Basal cell carcinoma     Comment:  Right posterior neck. Nodular. Mohs pending. No date: History of chicken pox No date: History of measles No date: History of mumps No date: Hypertension  Past Surgical History: 1970: HERNIA REPAIR  BMI    Body Mass Index: 30.99 kg/m      Reproductive/Obstetrics negative OB ROS                             Anesthesia Physical Anesthesia Plan  ASA: 2  Anesthesia Plan: General   Post-op Pain Management:    Induction: Intravenous  PONV Risk Score and Plan: Propofol infusion and TIVA  Airway Management Planned: Natural Airway and Nasal Cannula  Additional Equipment:   Intra-op Plan:   Post-operative Plan:   Informed Consent: I have reviewed the patients History and Physical, chart, labs and discussed the procedure including the risks, benefits and alternatives for the proposed anesthesia with the patient or authorized representative who has  indicated his/her understanding and acceptance.     Dental Advisory Given  Plan Discussed with: CRNA and Surgeon  Anesthesia Plan Comments:        Anesthesia Quick Evaluation

## 2023-05-25 NOTE — Transfer of Care (Signed)
Immediate Anesthesia Transfer of Care Note  Patient: Andrew Green  Procedure(s) Performed: COLONOSCOPY WITH PROPOFOL POLYPECTOMY  Patient Location: Endoscopy Unit  Anesthesia Type:General  Level of Consciousness: drowsy and patient cooperative  Airway & Oxygen Therapy: Patient Spontanous Breathing and Patient connected to face mask oxygen  Post-op Assessment: Report given to RN and Patient moving all extremities  Post vital signs: Reviewed and stable  Last Vitals:  Vitals Value Taken Time  BP 98/61 05/25/23 0919  Temp    Pulse 87 05/25/23 0919  Resp 21 05/25/23 0919  SpO2 97 % 05/25/23 0919  Vitals shown include unfiled device data.  Last Pain:  Vitals:   05/25/23 0919  TempSrc:   PainSc: 0-No pain         Complications: No notable events documented.

## 2023-05-25 NOTE — Anesthesia Postprocedure Evaluation (Signed)
Anesthesia Post Note  Patient: Andrew Green  Procedure(s) Performed: COLONOSCOPY WITH PROPOFOL POLYPECTOMY  Patient location during evaluation: PACU Anesthesia Type: General Level of consciousness: awake and awake and alert Pain management: satisfactory to patient Vital Signs Assessment: post-procedure vital signs reviewed and stable Respiratory status: spontaneous breathing and nonlabored ventilation Cardiovascular status: stable Anesthetic complications: no   No notable events documented.   Last Vitals:  Vitals:   05/25/23 0919 05/25/23 0929  BP: 98/61 114/78  Pulse: 85 85  Resp: 16 18  Temp:    SpO2: 96% 95%    Last Pain:  Vitals:   05/25/23 0929  TempSrc:   PainSc: 0-No pain                 VAN STAVEREN,Frady Taddeo

## 2023-05-25 NOTE — Anesthesia Procedure Notes (Signed)
Procedure Name: General with mask airway Date/Time: 05/25/2023 9:04 AM  Performed by: Lily Lovings, CRNAPre-anesthesia Checklist: Patient identified, Emergency Drugs available, Suction available, Patient being monitored and Timeout performed Patient Re-evaluated:Patient Re-evaluated prior to induction Oxygen Delivery Method: Simple face mask Preoxygenation: Pre-oxygenation with 100% oxygen Induction Type: IV induction

## 2023-05-25 NOTE — Op Note (Signed)
[facility] Patient Name: Andrew Green Procedure Date: 05/25/2023 8:49 AM MRN: [id number] Account #: [id number] Date of Birth: Jul 23, 1966 Admit Type: Outpatient Age: 57 Room: [facility] ENDO ROOM 4 Gender: Male Note Status: Finalized Instrument Name: Prentice Docker [id number] Procedure:             Colonoscopy Indications:           Surveillance: Personal history of adenomatous polyps                         on last colonoscopy > 3 years ago Providers:             Wyline Mood MD, MD Referring MD:          Demetrios Isaacs. Sherrie Mustache, MD (Referring MD) Medicines:             Monitored Anesthesia Care Complications:         No immediate complications. Procedure:             Pre-Anesthesia Assessment:                        - Prior to the procedure, a History and Physical was                         performed, and patient medications, allergies and                         sensitivities were reviewed. The patient's tolerance                         of previous anesthesia was reviewed.                        - The risks and benefits of the procedure and the                         sedation options and risks were discussed with the                         patient. All questions were answered and informed                         consent was obtained.                        - ASA Grade Assessment: II - A patient with mild                         systemic disease.                        After obtaining informed consent, the colonoscope was                         passed under direct vision. Throughout the procedure,                         the patient's blood pressure, pulse, and oxygen  saturations were monitored continuously. The                         Colonoscope was introduced through the anus and                         advanced to the the cecum, identified by the                         appendiceal orifice. The colonoscopy was performed                          with ease. The patient tolerated the procedure well.                         The quality of the bowel preparation was excellent.                         The ileocecal valve, appendiceal orifice, and rectum                         were photographed. Findings:      The perianal and digital rectal examinations were normal.      Two sessile polyps were found in the descending colon and cecum. The       polyps were 4 to 5 mm in size. These polyps were removed with a cold       snare. Resection and retrieval were complete.      Multiple medium-mouthed diverticula were found in the sigmoid colon.      The exam was otherwise without abnormality on direct and retroflexion       views. Impression:            - Two 4 to 5 mm polyps in the descending colon and in                         the cecum, removed with a cold snare. Resected and                         retrieved.                        - Diverticulosis in the sigmoid colon.                        - The examination was otherwise normal on direct and                         retroflexion views. Recommendation:        - Discharge patient to home.                        - Resume previous diet.                        - Continue present medications.                        - Await pathology results.                        -  Repeat colonoscopy for surveillance based on                         pathology results.                        - Return to GI office. Procedure Code(s):     --- Professional ---                        [phone], Colonoscopy, flexible; with removal of                         tumor(s), polyp(s), or other lesion(s) by snare                         technique Diagnosis Code(s):     --- Professional ---                        Z86.010, Personal history of colonic polyps                        D12.4, Benign neoplasm of descending colon                        D12.0, Benign neoplasm of cecum                        K57.30,  Diverticulosis of large intestine without                         perforation or abscess without bleeding CPT copyright 2022 American Medical Association. All rights reserved. The codes documented in this report are preliminary and upon coder review may  be revised to meet current compliance requirements. Wyline Mood, MD Wyline Mood MD, MD 05/25/2023 9:16:13 AM This report has been signed electronically. Number of Addenda: 0 Note Initiated On: 05/25/2023 8:49 AM Scope Withdrawal Time: 0 hours 11 minutes 1 second  Total Procedure Duration: 0 hours 12 minutes 46 seconds  Estimated Blood Loss:  Estimated blood loss: none.      [facility]

## 2023-05-25 NOTE — H&P (Signed)
Wyline Mood, MD [address], [place], [place], [address] [address], [place], [place], [address] Phone: [phone]  Fax: [phone]  Primary Care Physician:  Malva Limes, MD   Pre-Procedure History & Physical: HPI:  Andrew Green is a 57 y.o. male is here for an colonoscopy.   Past Medical History:  Diagnosis Date   Basal cell carcinoma 03/14/2023   Right posterior neck. Nodular. Mohs pending.   History of chicken pox    History of measles    History of mumps    Hypertension     Past Surgical History:  Procedure Laterality Date   HERNIA REPAIR  1970    Prior to Admission medications   Medication Sig Start Date End Date Taking? Authorizing Provider  amLODipine (NORVASC) 10 MG tablet Take 1 tablet (10 mg total) by mouth daily. TAKE 1 TABLET(5 MG) BY MOUTH DAILY 04/03/23  Yes Malva Limes, MD  tadalafil (CIALIS) 20 MG tablet TAKE ONE-HALF TO ONE TABLET BY MOUTH EVERY OTHER DAYS AS NEEDED FOR ERECTILE DYSFUNCTION 04/03/23   Malva Limes, MD    Allergies as of 04/04/2023 - Review Complete 04/03/2023  Allergen Reaction Noted   Other  02/07/2019    Family History  Problem Relation Age of Onset   Cervical cancer Mother    Colon polyps Father    Lung cancer Maternal Grandmother    Brain cancer Maternal Grandmother    Congestive Heart Failure Paternal Grandmother    CAD Neg Hx     Social History   Socioeconomic History   Marital status: Married    Spouse name: Not on file   Number of children: 2   Years of education: Not on file   Highest education level: Not on file  Occupational History   Occupation: Chartered certified accountant  Tobacco Use   Smoking status: Never   Smokeless tobacco: Never  Substance and Sexual Activity   Alcohol use: Not Currently    Alcohol/week: 0.0 standard drinks of alcohol    Comment: 2-3 times a year   Drug use: No   Sexual activity: Not on file  Other Topics Concern   Not on file  Social History Narrative    Not on file   Social Determinants of Health   Financial Resource Strain: Not on file  Food Insecurity: Not on file  Transportation Needs: Not on file  Physical Activity: Not on file  Stress: Not on file  Social Connections: Not on file  Intimate Partner Violence: Not on file    Review of Systems: See HPI, otherwise negative ROS  Physical Exam: BP (!) 140/96   Pulse 92   Temp (!) 97.1 F (36.2 C) (Temporal)   Resp 16   Ht 5\' 10"  (1.778 m)   Wt 98 kg   SpO2 97%   BMI 30.99 kg/m  General:   Alert,  pleasant and cooperative in NAD Head:  Normocephalic and atraumatic. Neck:  Supple; no masses or thyromegaly. Lungs:  Clear throughout to auscultation, normal respiratory effort.    Heart:  +S1, +S2, Regular rate and rhythm, No edema. Abdomen:  Soft, nontender and nondistended. Normal bowel sounds, without guarding, and without rebound.   Neurologic:  Alert and  oriented x4;  grossly normal neurologically.  Impression/Plan: Andrew Green is here for an colonoscopy to be performed for surveillance due to prior history of colon polyps   Risks, benefits, limitations, and alternatives regarding  colonoscopy have been reviewed with the patient.  Questions have been answered.  All parties agreeable.   Wyline Mood, MD  05/25/2023, 8:18 AM

## 2023-05-28 ENCOUNTER — Encounter: Payer: Self-pay | Admitting: Gastroenterology

## 2023-05-28 LAB — SURGICAL PATHOLOGY

## 2023-05-29 ENCOUNTER — Encounter: Payer: Self-pay | Admitting: Gastroenterology

## 2023-07-04 ENCOUNTER — Ambulatory Visit: Payer: BC Managed Care – PPO | Admitting: Family Medicine

## 2023-07-04 ENCOUNTER — Encounter: Payer: Self-pay | Admitting: Family Medicine

## 2023-07-04 VITALS — BP 132/80 | HR 89 | Ht 70.5 in | Wt 217.0 lb

## 2023-07-04 DIAGNOSIS — I10 Essential (primary) hypertension: Secondary | ICD-10-CM

## 2023-07-04 DIAGNOSIS — Z85828 Personal history of other malignant neoplasm of skin: Secondary | ICD-10-CM | POA: Insufficient documentation

## 2023-07-04 NOTE — Progress Notes (Signed)
      Established patient visit   Patient: Andrew Green   DOB: 10-24-1965   57 y.o. Male  MRN: [id number] Visit Date: 07/04/2023  Today's healthcare provider: [name], MD   Chief Complaint  Patient presents with   Hypertension   Subjective    Discussed the use of AI scribe software for clinical note transcription with the patient, who gave verbal consent to proceed.  History of Present Illness   The patient, known with hypertension, recently had an increase in the dose of his amlodipine to 10mg . He reports no adverse effects or side effects from the medication change and feels generally well. He has not been monitoring his blood pressure at home but denies any symptoms suggestive of cardiovascular compromise such as chest pain, palpitations, or shortness of breath.      Medications: Outpatient Medications Prior to Visit  Medication Sig   amLODipine (NORVASC) 10 MG tablet Take 1 tablet (10 mg total) by mouth daily. TAKE 1 TABLET(5 MG) BY MOUTH DAILY   tadalafil (CIALIS) 20 MG tablet TAKE ONE-HALF TO ONE TABLET BY MOUTH EVERY OTHER DAYS AS NEEDED FOR ERECTILE DYSFUNCTION   No facility-administered medications prior to visit.   Review of Systems  Constitutional:  Negative for appetite change, chills and fever.  Respiratory:  Negative for chest tightness, shortness of breath and wheezing.   Cardiovascular:  Negative for chest pain and palpitations.  Gastrointestinal:  Negative for abdominal pain, nausea and vomiting.       Objective    BP 132/80   Pulse 89   Ht 5' 10.5" (1.791 m)   Wt 217 lb (98.4 kg)   BMI 30.70 kg/m   Physical Exam  General appearance: Overweight male, cooperative and in no acute distress Head: Normocephalic, without obvious abnormality, atraumatic Respiratory: Respirations even and unlabored, normal respiratory rate Extremities: All extremities are intact.  Skin: Skin color, texture, turgor normal. No rashes seen  Psych: Appropriate mood  and affect. Neurologic: Mental status: Alert, oriented to person, place, and time, thought content appropriate.   Assessment & Plan        Hypertension Blood pressure slightly elevated but improved from previous readings. No side effects reported from increased dose of Amlodipine to 10mg . No symptoms of chest pain, heart flutters, or shortness of breath. -Continue Amlodipine 10mg  daily. -Advise low sodium diet. -Check blood pressure at next physical in August.  Medication Refills Current prescription for Amlodipine is for 30-day supply. -Change prescription to 90-day supply with sufficient refills until next physical in August.  Follow-up Schedule regular physical for August.    Return in about 9 months (around 04/02/2024) for Yearly Physical.      [name], MD  [facility] [phone] (phone) [phone] (fax)  [facility]

## 2023-07-04 NOTE — Patient Instructions (Signed)
.   Please review the attached list of medications and notify my office if there are any errors.   . Please bring all of your medications to every appointment so we can make sure that our medication list is the same as yours.   

## 2023-07-18 ENCOUNTER — Other Ambulatory Visit: Payer: Self-pay | Admitting: Family Medicine

## 2023-07-18 DIAGNOSIS — I10 Essential (primary) hypertension: Secondary | ICD-10-CM

## 2023-07-18 MED ORDER — AMLODIPINE BESYLATE 10 MG PO TABS
10.0000 mg | ORAL_TABLET | Freq: Every day | ORAL | 3 refills | Status: DC
Start: 2023-07-18 — End: 2023-09-28

## 2023-09-28 ENCOUNTER — Other Ambulatory Visit: Payer: Self-pay | Admitting: Family Medicine

## 2023-09-28 DIAGNOSIS — I10 Essential (primary) hypertension: Secondary | ICD-10-CM

## 2023-10-15 ENCOUNTER — Telehealth: Payer: Self-pay

## 2023-10-15 NOTE — Telephone Encounter (Signed)
-----   Message from Laurel sent at 10/13/2023  8:30 PM EST ----- Regarding: needs FBSE Hi team, please call to schedule FBSE when next available. Hx BCC found 02/2023 ----- Message ----- From: Interface, Lab In [facility] Sent: 03/21/2023   6:21 PM EST To: Elie Goody, MD

## 2023-10-15 NOTE — Telephone Encounter (Signed)
 Called patient. TBSE scheduled 11/19/2023 at 4:15 pm. Hx BCC.

## 2023-11-19 ENCOUNTER — Ambulatory Visit: Payer: BC Managed Care – PPO | Admitting: Dermatology

## 2023-11-19 ENCOUNTER — Encounter: Payer: Self-pay | Admitting: Dermatology

## 2023-11-19 DIAGNOSIS — D229 Melanocytic nevi, unspecified: Secondary | ICD-10-CM

## 2023-11-19 DIAGNOSIS — D1801 Hemangioma of skin and subcutaneous tissue: Secondary | ICD-10-CM

## 2023-11-19 DIAGNOSIS — Z1283 Encounter for screening for malignant neoplasm of skin: Secondary | ICD-10-CM

## 2023-11-19 DIAGNOSIS — L814 Other melanin hyperpigmentation: Secondary | ICD-10-CM

## 2023-11-19 DIAGNOSIS — L578 Other skin changes due to chronic exposure to nonionizing radiation: Secondary | ICD-10-CM

## 2023-11-19 DIAGNOSIS — Z85828 Personal history of other malignant neoplasm of skin: Secondary | ICD-10-CM

## 2023-11-19 DIAGNOSIS — L821 Other seborrheic keratosis: Secondary | ICD-10-CM

## 2023-11-19 DIAGNOSIS — W908XXA Exposure to other nonionizing radiation, initial encounter: Secondary | ICD-10-CM | POA: Diagnosis not present

## 2023-11-19 DIAGNOSIS — L720 Epidermal cyst: Secondary | ICD-10-CM

## 2023-11-19 NOTE — Patient Instructions (Addendum)

## 2023-11-19 NOTE — Progress Notes (Signed)
   Follow-Up Visit   Subjective  Andrew Green is a 58 y.o. male who presents for the following: Skin Cancer Screening and Upper Body Skin Exam. Patient with history of BCC.   The patient presents for Upper Body Skin Exam (UBSE) for skin cancer screening and mole check. The patient has spots, moles and lesions to be evaluated, some may be new or changing and the patient may have concern these could be cancer.   The following portions of the chart were reviewed this encounter and updated as appropriate: medications, allergies, medical history  Review of Systems:  No other skin or systemic complaints except as noted in HPI or Assessment and Plan.  Objective  Well appearing patient in no apparent distress; mood and affect are within normal limits.  All skin waist up examined. Relevant physical exam findings are noted in the Assessment and Plan.    Assessment & Plan   MULTIPLE BENIGN NEVI   LENTIGINES   ACTINIC ELASTOSIS   SEBORRHEIC KERATOSES   CHERRY ANGIOMA   EIC (EPIDERMAL INCLUSION CYST)   Skin cancer screening performed today.  Actinic Damage - Chronic condition, secondary to cumulative UV/sun exposure - diffuse scaly erythematous macules with underlying dyspigmentation - Recommend daily broad spectrum sunscreen SPF 30+ to sun-exposed areas, reapply every 2 hours as needed.  - Staying in the shade or wearing long sleeves, sun glasses (UVA+UVB protection) and wide brim hats (4-inch brim around the entire circumference of the hat) are also recommended for sun protection.  - Call for new or changing lesions.  Lentigines, Seborrheic Keratoses, Hemangiomas - Benign normal skin lesions - Benign-appearing - Call for any changes  SEBORRHEIC KERATOSIS - Stuck-on, waxy, tan-brown papules and/or plaques at L upper chest, R calf. - Benign-appearing - Discussed benign etiology and prognosis. - Observe - Call for any changes  Melanocytic Nevi - Tan-brown and/or  pink-flesh-colored symmetric macules and papules - Benign appearing on exam today - Observation - Call clinic for new or changing moles - Recommend daily use of broad spectrum spf 30+ sunscreen to sun-exposed areas.   HISTORY OF BASAL CELL CARCINOMA OF THE SKIN Right posterior neck nodular- MOHs 05/16/23 - No evidence of recurrence today - Recommend regular full body skin exams - Recommend daily broad spectrum sunscreen SPF 30+ to sun-exposed areas, reapply every 2 hours as needed.  - Call if any new or changing lesions are noted between office visits  EPIDERMAL INCLUSION CYST Exam: Subcutaneous nodule at R upper arm.  Benign-appearing. Exam most consistent with an epidermal inclusion cyst. Discussed that a cyst is a benign growth that can grow over time and sometimes get irritated or inflamed. Recommend observation if it is not bothersome. Discussed option of surgical excision to remove it if it is growing, symptomatic, or other changes noted. Please call for new or changing lesions so they can be evaluated.     Return in about 6 months (around 05/20/2024) for Hx BCC, w/ Dr. Katrinka Blazing.  Wynonia Lawman, CMA, am acting as scribe for Elie Goody, MD .   Documentation: I have reviewed the above documentation for accuracy and completeness, and I agree with the above.  Elie Goody, MD

## 2024-04-04 ENCOUNTER — Ambulatory Visit (INDEPENDENT_AMBULATORY_CARE_PROVIDER_SITE_OTHER): Payer: Self-pay | Admitting: Family Medicine

## 2024-04-04 ENCOUNTER — Encounter: Payer: Self-pay | Admitting: Family Medicine

## 2024-04-04 VITALS — BP 126/87 | HR 88 | Ht 70.0 in | Wt 221.0 lb

## 2024-04-04 DIAGNOSIS — Z0000 Encounter for general adult medical examination without abnormal findings: Secondary | ICD-10-CM | POA: Diagnosis not present

## 2024-04-04 DIAGNOSIS — I10 Essential (primary) hypertension: Secondary | ICD-10-CM | POA: Diagnosis not present

## 2024-04-04 DIAGNOSIS — Z125 Encounter for screening for malignant neoplasm of prostate: Secondary | ICD-10-CM | POA: Diagnosis not present

## 2024-04-04 DIAGNOSIS — Z85828 Personal history of other malignant neoplasm of skin: Secondary | ICD-10-CM | POA: Diagnosis not present

## 2024-04-04 DIAGNOSIS — Z860101 Personal history of adenomatous and serrated colon polyps: Secondary | ICD-10-CM

## 2024-04-04 NOTE — Patient Instructions (Signed)
 Please review the attached list of medications and notify my office if there are any errors.   I strongly recommend two doses of Shingrix (the shingles vaccine) separated by 2 to 6 months for adults age 57 years and older. I recommend checking with your insurance plan regarding coverage for this vaccine.   I recommend that you get the Prevnar 20 vaccine to protect yourself from certain dangerous strains of pneumonia. You can get Prevnar 20 at your pharmacy, or call our office at [phone] at your earliest convenience to schedule this vaccine.

## 2024-04-04 NOTE — Progress Notes (Signed)
 Complete physical exam   Patient: Andrew Green   DOB: 02/02/1966   58 y.o. Male  MRN: [id number] Visit Date: 04/04/2024  Today's healthcare provider: Nancyann Perry, MD   Chief Complaint  Patient presents with   Annual Exam   Care Management    Pattern of eating:General   Are you exercising:Yes   What type of exercising: walking   How long: mile  How frequent: 5 days week   Vaccine: Denied       Subjective    Discussed the use of AI scribe software for clinical note transcription with the patient, who gave verbal consent to proceed.  History of Present Illness   Andrew Green is a 58 year old male who presents for an annual physical exam.  He is currently taking amlodipine for hypertension management, which is effective without any episodes of concern. He is due for routine blood work including cholesterol, glucose, and PSA tests, which he typically gets done during his annual visits.  He mentions previously being informed about low testosterone levels but is not interested in further evaluation or treatment as he is not planning to have more children.  He has declined vaccines, including the pneumonia and shingles vaccines.  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 He reports getting his daily steps in through his work activities.  No respiratory symptoms such as shortness of breath, coughing, or wheezing. No issues with balance or dizziness, except when breathing heavily for extended periods.     Lab Results  Component Value Date   NA 143 04/03/2023   K 4.2 04/03/2023   CREATININE 1.14 04/03/2023   EGFR 75 04/03/2023   GLUCOSE 100 (H) 04/03/2023   Wt Readings from Last 3 Encounters:  04/04/24 221 lb (100.2 kg)  07/04/23 217 lb (98.4 kg)  05/25/23 216 lb (98 kg)    HPI     Care Management    Additional comments: Pattern of  eating:General   Are you exercising:Yes   What type of exercising: walking   How long: mile  How frequent: 5 days week   Vaccine: Denied          Last edited by Thelbert Eulalio HERO, CMA on 04/04/2024  8:46 AM.       Past Medical History:  Diagnosis Date   Basal cell carcinoma 03/14/2023   Right posterior neck. Nodular. Mohs 05/16/2023 with Dr. Gregorio   History of chicken pox    History of COVID-19 02/11/2020   History of measles    History of mumps    Hypertension    Past Surgical History:  Procedure Laterality Date   COLONOSCOPY WITH PROPOFOL N/A 05/25/2023   Procedure: COLONOSCOPY WITH PROPOFOL;  Surgeon: Therisa Bi, MD;  Location: [facility] ENDOSCOPY;  Service: Gastroenterology;  Laterality: N/A;   HERNIA REPAIR  08/21/1968   MOHS SURGERY  05/16/2023   BCC right posterior helix, Arvella Caldwell MD, [facility]   POLYPECTOMY  05/25/2023   Procedure: POLYPECTOMY;  Surgeon: Therisa Bi, MD;  Location: [facility] ENDOSCOPY;  Service: Gastroenterology;;   Social History   Socioeconomic History   Marital status: Married    Spouse name: Not on file   Number of children: 2   Years of education: Not on file   Highest education level: Not on file  Occupational History   Occupation: Machinist  Tobacco Use   Smoking status:  Never   Smokeless tobacco: Never  Substance and Sexual Activity   Alcohol use: Not Currently    Alcohol/week: 0.0 standard drinks of alcohol    Comment: 2-3 times a year   Drug use: No   Sexual activity: Not Currently  Other Topics Concern   Not on file  Social History Narrative   Not on file   Social Drivers of Health   Financial Resource Strain: Low Risk  (04/04/2024)   Overall Financial Resource Strain (CARDIA)    Difficulty of Paying Living Expenses: Not hard at all  Food Insecurity: No Food Insecurity (04/04/2024)   Hunger Vital Sign    Worried About Running Out of Food in the Last Year: Never true    Ran Out of Food in the Last Year: Never true   Transportation Needs: No Transportation Needs (04/04/2024)   PRAPARE - Administrator, Civil Service (Medical): No    Lack of Transportation (Non-Medical): No  Physical Activity: Not on file  Stress: No Stress Concern Present (04/04/2024)   [employer] of Occupational Health - Occupational Stress Questionnaire    Feeling of Stress: Not at all  Social Connections: Not on file  Intimate Partner Violence: Not At Risk (04/04/2024)   Humiliation, Afraid, Rape, and Kick questionnaire    Fear of Current or Ex-Partner: No    Emotionally Abused: No    Physically Abused: No    Sexually Abused: No   Family Status  Relation Name Status   Mother  Alive   Father  Deceased   Sister  Alive   MGM  Deceased   PGM  Deceased   Daughter  Alive   Daughter  Alive   Neg Hx  (Not Specified)  No partnership data on file   Family History  Problem Relation Age of Onset   Cervical cancer Mother    Colon polyps Father    Lung cancer Maternal Grandmother    Brain cancer Maternal Grandmother    Congestive Heart Failure Paternal Grandmother    CAD Neg Hx    Allergies  Allergen Reactions   Other     Patient Care Team: Gasper Nancyann BRAVO, MD as PCP - General (Family Medicine) Hester Alm BROCKS, MD (Dermatology) Claudene Lehmann, MD as Consulting Physician (Dermatology) [facility], [facility] (Optometry)   Medications: Outpatient Medications Prior to Visit  Medication Sig   amLODipine (NORVASC) 10 MG tablet TAKE 1 TABLET BY MOUTH EVERY DAY   tadalafil (CIALIS) 20 MG tablet TAKE ONE-HALF TO ONE TABLET BY MOUTH EVERY OTHER DAYS AS NEEDED FOR ERECTILE DYSFUNCTION   No facility-administered medications prior to visit.    Review of Systems  Constitutional:  Negative for appetite change, chills and fever.  Respiratory:  Negative for chest tightness, shortness of breath and wheezing.   Cardiovascular:  Negative for chest pain and palpitations.  Gastrointestinal:  Negative for  abdominal pain, nausea and vomiting.      Objective    BP 126/87   Pulse 88   Ht 5' 10 (1.778 m)   Wt 221 lb (100.2 kg)   SpO2 95%   BMI 31.71 kg/m    Physical Exam  General Appearance:    Mildly obese male. Alert, cooperative, in no acute distress, appears stated age  Head:    Normocephalic, without obvious abnormality, atraumatic  Eyes:    PERRL, conjunctiva/corneas clear, EOM's intact, fundi    benign, both eyes       Ears:  Normal TM's and external ear canals, both ears  Nose:   Nares normal, septum midline, mucosa normal, no drainage   or sinus tenderness  Throat:   Lips, mucosa, and tongue normal; teeth and gums normal  Neck:   Supple, symmetrical, trachea midline, no adenopathy;       thyroid :  No enlargement/tenderness/nodules; no carotid   bruit or JVD  Back:     Symmetric, no curvature, ROM normal, no CVA tenderness  Lungs:     Clear to auscultation bilaterally, respirations unlabored  Chest wall:    No tenderness or deformity  Heart:    Normal heart rate. Normal rhythm. No murmurs, rubs, or gallops.  S1 and S2 normal  Abdomen:     Soft, non-tender, bowel sounds active all four quadrants,    no masses, no organomegaly  Genitalia:    deferred  Rectal:    deferred  Extremities:   All extremities are intact. No cyanosis or edema  Pulses:   2+ and symmetric all extremities  Skin:   Skin color, texture, turgor normal, no rashes or lesions  Lymph nodes:   Cervical, supraclavicular, and axillary nodes normal  Neurologic:   CNII-XII intact. Normal strength, sensation and reflexes      throughout       Last depression screening scores    04/04/2024    8:50 AM 07/04/2023    8:19 AM 04/03/2023    8:29 AM  PHQ 2/9 Scores  PHQ - 2 Score 0 0 0  PHQ- 9 Score 1     Last fall risk screening    07/04/2023    8:19 AM  Fall Risk   Falls in the past year? 0  Number falls in past yr: 0  Injury with Fall? 0  Risk for fall due to : No Fall Risks   Last Audit-C  alcohol use screening    04/04/2024    8:50 AM  Alcohol Use Disorder Test (AUDIT)  1. How often do you have a drink containing alcohol? 0  2. How many drinks containing alcohol do you have on a typical day when you are drinking? 0  3. How often do you have six or more drinks on one occasion? 0  AUDIT-C Score 0   A score of 3 or more in women, and 4 or more in men indicates increased risk for alcohol abuse, EXCEPT if all of the points are from question 1   No results found for any visits on 04/04/24.  Assessment & Plan    Routine Health Maintenance and Physical Exam  Exercise Activities and Dietary recommendations  Goals   None     Immunization History  Administered Date(s) Administered   Influenza-Unspecified 06/15/2017, 06/18/2018   Tdap 12/20/2009, 05/26/2018    Health Maintenance  Topic Date Due   COVID-19 Vaccine (1) Never done   Hepatitis B Vaccines 19-59 Average Risk (1 of 3 - 19+ 3-dose series) Never done   Zoster Vaccines- Shingrix (1 of 2) Never done   Pneumococcal Vaccine: 50+ Years (1 of 1 - PCV) Never done   INFLUENZA VACCINE  03/21/2024   DTaP/Tdap/Td (3 - Td or Tdap) 05/26/2028   Colonoscopy  05/24/2030   Hepatitis C Screening  Completed   HIV Screening  Completed   HPV VACCINES  Aged Out   Meningococcal B Vaccine  Aged Out    Discussed health benefits of physical activity, and encouraged him to engage in regular exercise appropriate for his age and condition.  Counseled on recommendations for Shingles and pneumococcal vaccines which he declined today.   2. Prostate cancer screening  - PSA Total (Reflex To Free)  3. History of basal cell carcinoma (BCC) of skin Continue regular follow up with dermatology.   4. History of adenomatous polyp of colon Next colonoscopy due 2031  5. Primary hypertension Well controlled.  Continue current medications.   - CBC - Comprehensive metabolic panel with GFR - Lipid panel    Nancyann Perry, MD  [facility] [phone] (phone) [phone] (fax)  [facility]

## 2024-04-05 LAB — CBC
Hematocrit: 45.7 % (ref 37.5–51.0)
Hemoglobin: 15.3 g/dL (ref 13.0–17.7)
MCH: 31.1 pg (ref 26.6–33.0)
MCHC: 33.5 g/dL (ref 31.5–35.7)
MCV: 93 fL (ref 79–97)
Platelets: 249 x10E3/uL (ref 150–450)
RBC: 4.92 x10E6/uL (ref 4.14–5.80)
RDW: 13.8 % (ref 11.6–15.4)
WBC: 6 x10E3/uL (ref 3.4–10.8)

## 2024-04-05 LAB — LIPID PANEL
Chol/HDL Ratio: 3.4 ratio (ref 0.0–5.0)
Cholesterol, Total: 175 mg/dL (ref 100–199)
HDL: 51 mg/dL (ref >39)
LDL Chol Calc (NIH): 108 mg/dL — ABNORMAL HIGH (ref 0–99)
Triglycerides: 89 mg/dL (ref 0–149)
VLDL Cholesterol Cal: 16 mg/dL (ref 5–40)

## 2024-04-05 LAB — COMPREHENSIVE METABOLIC PANEL WITH GFR
ALT: 15 IU/L (ref 0–44)
AST: 17 IU/L (ref 0–40)
Albumin: 4.1 g/dL (ref 3.8–4.9)
Alkaline Phosphatase: 78 IU/L (ref 44–121)
BUN/Creatinine Ratio: 12 (ref 9–20)
BUN: 13 mg/dL (ref 6–24)
Bilirubin Total: 0.7 mg/dL (ref 0.0–1.2)
CO2: 24 mmol/L (ref 20–29)
Calcium: 8.6 mg/dL — ABNORMAL LOW (ref 8.7–10.2)
Chloride: 103 mmol/L (ref 96–106)
Creatinine, Ser: 1.1 mg/dL (ref 0.76–1.27)
Globulin, Total: 2.6 g/dL (ref 1.5–4.5)
Glucose: 93 mg/dL (ref 70–99)
Potassium: 3.6 mmol/L (ref 3.5–5.2)
Sodium: 141 mmol/L (ref 134–144)
Total Protein: 6.7 g/dL (ref 6.0–8.5)
eGFR: 78 mL/min/1.73 (ref >59)

## 2024-04-05 LAB — PSA TOTAL (REFLEX TO FREE): Prostate Specific Ag, Serum: 2.5 ng/mL (ref 0.0–4.0)

## 2024-04-09 ENCOUNTER — Ambulatory Visit: Payer: Self-pay | Admitting: Family Medicine

## 2024-05-20 ENCOUNTER — Ambulatory Visit: Admitting: Dermatology

## 2024-05-20 ENCOUNTER — Encounter: Payer: Self-pay | Admitting: Dermatology

## 2024-05-20 DIAGNOSIS — W908XXA Exposure to other nonionizing radiation, initial encounter: Secondary | ICD-10-CM

## 2024-05-20 DIAGNOSIS — L821 Other seborrheic keratosis: Secondary | ICD-10-CM

## 2024-05-20 DIAGNOSIS — D1801 Hemangioma of skin and subcutaneous tissue: Secondary | ICD-10-CM

## 2024-05-20 DIAGNOSIS — Z85828 Personal history of other malignant neoplasm of skin: Secondary | ICD-10-CM

## 2024-05-20 DIAGNOSIS — L814 Other melanin hyperpigmentation: Secondary | ICD-10-CM

## 2024-05-20 DIAGNOSIS — L578 Other skin changes due to chronic exposure to nonionizing radiation: Secondary | ICD-10-CM

## 2024-05-20 DIAGNOSIS — Z1283 Encounter for screening for malignant neoplasm of skin: Secondary | ICD-10-CM | POA: Diagnosis not present

## 2024-05-20 DIAGNOSIS — D229 Melanocytic nevi, unspecified: Secondary | ICD-10-CM

## 2024-05-20 DIAGNOSIS — L720 Epidermal cyst: Secondary | ICD-10-CM

## 2024-05-20 NOTE — Patient Instructions (Signed)
 Recommend daily broad spectrum sunscreen SPF 30+ to sun-exposed areas, reapply every 2 hours as needed. Call for new or changing lesions.  Staying in the shade or wearing long sleeves, sun glasses (UVA+UVB protection) and wide brim hats (4-inch brim around the entire circumference of the hat) are also recommended for sun protection.     Melanoma ABCDEs  Melanoma is the most dangerous type of skin cancer, and is the leading cause of death from skin disease.  You are more likely to develop melanoma if you: Have light-colored skin, light-colored eyes, or red or blond hair Spend a lot of time in the sun Tan regularly, either outdoors or in a tanning bed Have had blistering sunburns, especially during childhood Have a close family member who has had a melanoma Have atypical moles or large birthmarks  Early detection of melanoma is key since treatment is typically straightforward and cure rates are extremely high if we catch it early.   The first sign of melanoma is often a change in a mole or a new dark spot.  The ABCDE system is a way of remembering the signs of melanoma.  A for asymmetry:  The two halves do not match. B for border:  The edges of the growth are irregular. C for color:  A mixture of colors are present instead of an even brown color. D for diameter:  Melanomas are usually (but not always) greater than 6mm - the size of a pencil eraser. E for evolution:  The spot keeps changing in size, shape, and color.  Please check your skin once per month between visits. You can use a small mirror in front and a large mirror behind you to keep an eye on the back side or your body.   If you see any new or changing lesions before your next follow-up, please call to schedule a visit.  Please continue daily skin protection including broad spectrum sunscreen SPF 30+ to sun-exposed areas, reapplying every 2 hours as needed when you're outdoors.    Due to recent changes in healthcare laws, you  may see results of your pathology and/or laboratory studies on MyChart before the doctors have had a chance to review them. We understand that in some cases there may be results that are confusing or concerning to you. Please understand that not all results are received at the same time and often the doctors may need to interpret multiple results in order to provide you with the best plan of care or course of treatment. Therefore, we ask that you please give us  2 business days to thoroughly review all your results before contacting the office for clarification. Should we see a critical lab result, you will be contacted sooner.   If You Need Anything After Your Visit  If you have any questions or concerns for your doctor, please call our main line at [phone] and press option 4 to reach your doctor's medical assistant. If no one answers, please leave a voicemail as directed and we will return your call as soon as possible. Messages left after 4 pm will be answered the following business day.   You may also send us  a message via MyChart. We typically respond to MyChart messages within 1-2 business days.  For prescription refills, please ask your pharmacy to contact our office. Our fax number is [phone].  If you have an urgent issue when the clinic is closed that cannot wait until the next business day, you can page your doctor at  the number below.    Please note that while we do our best to be available for urgent issues outside of office hours, we are not available 24/7.   If you have an urgent issue and are unable to reach us , you may choose to seek medical care at your doctor's office, retail clinic, urgent care center, or emergency room.  If you have a medical emergency, please immediately call 911 or go to the emergency department.  Pager Numbers  - Dr. Hester: [phone]  - Dr. Jackquline: [phone]  - Dr. Claudene: [phone]   - Dr. Raymund: [phone]  In the event of  inclement weather, please call our main line at [phone] for an update on the status of any delays or closures.  Dermatology Medication Tips: Please keep the boxes that topical medications come in in order to help keep track of the instructions about where and how to use these. Pharmacies typically print the medication instructions only on the boxes and not directly on the medication tubes.   If your medication is too expensive, please contact our office at [phone] option 4 or send us  a message through MyChart.   We are unable to tell what your co-pay for medications will be in advance as this is different depending on your insurance coverage. However, we may be able to find a substitute medication at lower cost or fill out paperwork to get insurance to cover a needed medication.   If a prior authorization is required to get your medication covered by your insurance company, please allow us  1-2 business days to complete this process.  Drug prices often vary depending on where the prescription is filled and some pharmacies may offer cheaper prices.  The website www.goodrx.com contains coupons for medications through different pharmacies. The prices here do not account for what the cost may be with help from insurance (it may be cheaper with your insurance), but the website can give you the price if you did not use any insurance.  - You can print the associated coupon and take it with your prescription to the pharmacy.  - You may also stop by our office during regular business hours and pick up a GoodRx coupon card.  - If you need your prescription sent electronically to a different pharmacy, notify our office through [facility] or by phone at [phone] option 4.     Si Usted Necesita Algo Despus de Su Visita  Tambin puede enviarnos un mensaje a travs de Clinical cytogeneticist. Por lo general respondemos a los mensajes de MyChart en el transcurso de 1 a 2 das hbiles.  Para renovar  recetas, por favor pida a su farmacia que se ponga en contacto con nuestra oficina. Randi lakes de fax es [place] [phone].  Si tiene un asunto urgente cuando la clnica est cerrada y que no puede esperar hasta el siguiente da hbil, puede llamar/localizar a su doctor(a) al nmero que aparece a continuacin.   Por favor, tenga en cuenta que aunque hacemos todo lo posible para estar disponibles para asuntos urgentes fuera del horario de [place], no estamos disponibles las 24 horas del da, los 7 [address]  [address] de la [place].   Si tiene un problema urgente y no puede comunicarse con nosotros, puede optar por buscar atencin mdica  en el consultorio de su doctor(a), en una clnica privada, en un centro de atencin urgente o en una sala de emergencias.  Si tiene una emergencia mdica, por favor llame inmediatamente al 911 o vaya a  la sala de emergencias.  Nmeros de bper  - Dr. Hester: [phone]  - Dra. Jackquline: [phone]  - Dr. Claudene: [phone]  - Dra. Kitts: [phone]  En caso de inclemencias del [place], por favor llame a nuestra lnea principal al [phone] para una actualizacin sobre el estado de cualquier retraso o cierre.  Consejos para la medicacin en dermatologa: Por favor, guarde las cajas en las que vienen los medicamentos de uso tpico para ayudarle a seguir las instrucciones sobre dnde y cmo usarlos. Las farmacias generalmente imprimen las instrucciones del medicamento slo en las cajas y no directamente en los tubos del [place].   Si su medicamento es muy caro, por favor, pngase en contacto con landry rieger llamando al [phone] y presione la opcin 4 o envenos un mensaje a travs de Clinical cytogeneticist.   No podemos decirle cul ser su copago por los medicamentos por adelantado ya que esto es diferente dependiendo de la cobertura de su seguro. Sin embargo, es posible que podamos encontrar un medicamento sustituto a Audiological scientist un formulario para que el  seguro cubra el medicamento que se considera necesario.   Si se requiere una autorizacin previa para que su compaa de seguros malta su medicamento, por favor permtanos de 1 a 2 das hbiles para completar este proceso.  Los precios de los medicamentos varan con frecuencia dependiendo del Environmental consultant de dnde se surte la receta y alguna farmacias pueden ofrecer precios ms baratos.  El sitio web www.goodrx.com tiene cupones para medicamentos de Health and safety inspector. Los precios aqu no tienen en cuenta lo que podra costar con la ayuda del seguro (puede ser ms barato con su seguro), pero el sitio web puede darle el precio si no utiliz Tourist information centre manager.  - Puede imprimir el cupn correspondiente y llevarlo con su receta a la farmacia.  - Tambin puede pasar por nuestra oficina durante el horario de atencin regular y Education officer, museum una tarjeta de cupones de GoodRx.  - Si necesita que su receta se enve electrnicamente a una farmacia diferente, informe a nuestra oficina a travs de MyChart de [place] o por telfono llamando al [phone] y presione la opcin 4.

## 2024-05-20 NOTE — Progress Notes (Signed)
   Follow-Up Visit   Subjective  Andrew Green is a 58 y.o. male who presents for the following: Skin Cancer Screening and Full Body Skin Exam; hx of BCC. Patient reports no areas of concern.   The patient presents for Total-Body Skin Exam (TBSE) for skin cancer screening and mole check. The patient has spots, moles and lesions to be evaluated, some may be new or changing and the patient may have concern these could be cancer.    The following portions of the chart were reviewed this encounter and updated as appropriate: medications, allergies, medical history  Review of Systems:  No other skin or systemic complaints except as noted in HPI or Assessment and Plan.  Objective  Well appearing patient in no apparent distress; mood and affect are within normal limits.  A full examination was performed including scalp, head, eyes, ears, nose, lips, neck, chest, axillae, abdomen, back, buttocks, bilateral upper extremities, bilateral lower extremities, hands, feet, fingers, toes, fingernails, and toenails. All findings within normal limits unless otherwise noted below.   Relevant physical exam findings are noted in the Assessment and Plan.    Assessment & Plan   SKIN CANCER SCREENING PERFORMED TODAY.  ACTINIC DAMAGE - Chronic condition, secondary to cumulative UV/sun exposure - diffuse scaly erythematous macules with underlying dyspigmentation - Recommend daily broad spectrum sunscreen SPF 30+ to sun-exposed areas, reapply every 2 hours as needed.  - Staying in the shade or wearing long sleeves, sun glasses (UVA+UVB protection) and wide brim hats (4-inch brim around the entire circumference of the hat) are also recommended for sun protection.  - Call for new or changing lesions.  LENTIGINES, SEBORRHEIC KERATOSES, HEMANGIOMAS - Benign normal skin lesions - Benign-appearing - Call for any changes  MELANOCYTIC NEVI - Tan-brown and/or pink-flesh-colored symmetric macules and papules -  Benign appearing on exam today - Observation - Call clinic for new or changing moles - Recommend daily use of broad spectrum spf 30+ sunscreen to sun-exposed areas.   HISTORY OF BASAL CELL CARCINOMA OF THE SKIN Right posterior neck nodular- MOHs 05/16/23 - No evidence of recurrence today - Recommend regular full body skin exams - Recommend daily broad spectrum sunscreen SPF 30+ to sun-exposed areas, reapply every 2 hours as needed.  - Call if any new or changing lesions are noted between office visits   EPIDERMAL INCLUSION CYST Exam: Subcutaneous nodule at R upper arm.   Benign-appearing. Exam most consistent with an epidermal inclusion cyst. Discussed that a cyst is a benign growth that can grow over time and sometimes get irritated or inflamed. Recommend observation if it is not bothersome. Discussed option of surgical excision to remove it if it is growing, symptomatic, or other changes noted. Please call for new or changing lesions so they can be evaluated. LENTIGINES   MULTIPLE BENIGN NEVI   SEBORRHEIC KERATOSES   ACTINIC ELASTOSIS   CHERRY ANGIOMA   EIC (EPIDERMAL INCLUSION CYST)   Return in about 1 year (around 05/20/2025) for TBSE.  I, Emerick Ege, CMA am acting as scribe for Boneta Sharps, MD   Documentation: I have reviewed the above documentation for accuracy and completeness, and I agree with the above.  Boneta Sharps, MD

## 2025-04-10 ENCOUNTER — Encounter: Admitting: Family Medicine

## 2025-05-21 ENCOUNTER — Ambulatory Visit: Admitting: Dermatology
